# Patient Record
Sex: Female | Born: 1960 | Race: White | Hispanic: No | Marital: Married | Smoking: Never smoker
Health system: Southern US, Community
[De-identification: ages and names within clinical notes are randomized; demographics above are authoritative.]

## PROBLEM LIST (undated history)

## (undated) HISTORY — PX: GASTRECTOMY: SHX58

## (undated) HISTORY — PX: ABDOMINAL HYSTERECTOMY: SHX81

## (undated) HISTORY — PX: CHOLECYSTECTOMY: SHX55

---

## 1999-03-25 ENCOUNTER — Encounter: Payer: Self-pay | Admitting: Emergency Medicine

## 1999-03-25 ENCOUNTER — Emergency Department (HOSPITAL_COMMUNITY): Admission: EM | Admit: 1999-03-25 | Discharge: 1999-03-25 | Payer: Self-pay | Admitting: *Deleted

## 1999-03-27 ENCOUNTER — Encounter: Payer: Self-pay | Admitting: Urology

## 1999-03-27 ENCOUNTER — Ambulatory Visit (HOSPITAL_COMMUNITY): Admission: RE | Admit: 1999-03-27 | Discharge: 1999-03-27 | Payer: Self-pay | Admitting: Urology

## 1999-03-29 ENCOUNTER — Emergency Department (HOSPITAL_COMMUNITY): Admission: EM | Admit: 1999-03-29 | Discharge: 1999-03-29 | Payer: Self-pay | Admitting: Emergency Medicine

## 1999-03-29 ENCOUNTER — Encounter: Payer: Self-pay | Admitting: Urology

## 1999-08-21 ENCOUNTER — Other Ambulatory Visit: Admission: RE | Admit: 1999-08-21 | Discharge: 1999-08-21 | Payer: Self-pay | Admitting: Obstetrics and Gynecology

## 2000-02-25 ENCOUNTER — Encounter: Payer: Self-pay | Admitting: Family Medicine

## 2000-02-25 ENCOUNTER — Encounter: Admission: RE | Admit: 2000-02-25 | Discharge: 2000-02-25 | Payer: Self-pay | Admitting: Family Medicine

## 2000-08-15 ENCOUNTER — Emergency Department (HOSPITAL_COMMUNITY): Admission: EM | Admit: 2000-08-15 | Discharge: 2000-08-15 | Payer: Self-pay | Admitting: Emergency Medicine

## 2000-08-15 ENCOUNTER — Encounter: Payer: Self-pay | Admitting: *Deleted

## 2000-09-24 ENCOUNTER — Encounter: Payer: Self-pay | Admitting: Urology

## 2000-09-24 ENCOUNTER — Encounter: Admission: RE | Admit: 2000-09-24 | Discharge: 2000-09-24 | Payer: Self-pay | Admitting: Urology

## 2000-09-30 ENCOUNTER — Other Ambulatory Visit: Admission: RE | Admit: 2000-09-30 | Discharge: 2000-09-30 | Payer: Self-pay | Admitting: Obstetrics and Gynecology

## 2001-06-27 ENCOUNTER — Encounter: Payer: Self-pay | Admitting: Emergency Medicine

## 2001-06-27 ENCOUNTER — Emergency Department (HOSPITAL_COMMUNITY): Admission: EM | Admit: 2001-06-27 | Discharge: 2001-06-27 | Payer: Self-pay | Admitting: Emergency Medicine

## 2001-07-02 ENCOUNTER — Ambulatory Visit (HOSPITAL_COMMUNITY): Admission: RE | Admit: 2001-07-02 | Discharge: 2001-07-02 | Payer: Self-pay | Admitting: Urology

## 2001-07-02 ENCOUNTER — Encounter: Payer: Self-pay | Admitting: Urology

## 2001-07-16 ENCOUNTER — Encounter: Admission: RE | Admit: 2001-07-16 | Discharge: 2001-07-16 | Payer: Self-pay | Admitting: Urology

## 2001-07-16 ENCOUNTER — Encounter: Payer: Self-pay | Admitting: Urology

## 2001-10-15 ENCOUNTER — Other Ambulatory Visit: Admission: RE | Admit: 2001-10-15 | Discharge: 2001-10-15 | Payer: Self-pay | Admitting: Obstetrics and Gynecology

## 2002-10-25 ENCOUNTER — Other Ambulatory Visit: Admission: RE | Admit: 2002-10-25 | Discharge: 2002-10-25 | Payer: Self-pay | Admitting: Obstetrics and Gynecology

## 2003-12-12 ENCOUNTER — Other Ambulatory Visit: Admission: RE | Admit: 2003-12-12 | Discharge: 2003-12-12 | Payer: Self-pay | Admitting: Obstetrics and Gynecology

## 2003-12-27 ENCOUNTER — Ambulatory Visit (HOSPITAL_COMMUNITY): Admission: RE | Admit: 2003-12-27 | Discharge: 2003-12-27 | Payer: Self-pay | Admitting: Gastroenterology

## 2004-04-03 ENCOUNTER — Ambulatory Visit (HOSPITAL_COMMUNITY): Admission: RE | Admit: 2004-04-03 | Discharge: 2004-04-03 | Payer: Self-pay | Admitting: Gastroenterology

## 2004-04-09 ENCOUNTER — Encounter: Admission: RE | Admit: 2004-04-09 | Discharge: 2004-04-09 | Payer: Self-pay | Admitting: Gastroenterology

## 2004-04-24 ENCOUNTER — Observation Stay (HOSPITAL_COMMUNITY): Admission: RE | Admit: 2004-04-24 | Discharge: 2004-04-25 | Payer: Self-pay | Admitting: Surgery

## 2004-04-24 ENCOUNTER — Encounter (INDEPENDENT_AMBULATORY_CARE_PROVIDER_SITE_OTHER): Payer: Self-pay | Admitting: Specialist

## 2004-06-25 ENCOUNTER — Inpatient Hospital Stay (HOSPITAL_COMMUNITY): Admission: RE | Admit: 2004-06-25 | Discharge: 2004-06-27 | Payer: Self-pay | Admitting: Surgery

## 2005-03-25 ENCOUNTER — Other Ambulatory Visit: Admission: RE | Admit: 2005-03-25 | Discharge: 2005-03-25 | Payer: Self-pay | Admitting: Obstetrics and Gynecology

## 2005-04-20 IMAGING — RF DG CHOLANGIOGRAM OPERATIVE
1 series · 14 of 14 positions shown · non-contrast
Comparison: none

CLINICAL DATA: Gallstones.  
 OR CHOLANGIOGRAM 
 The common bile duct is well visualized without evidence of stone.  No obstruction of the common bile duct. There is contrast in the duodenum. 
 IMPRESSION 
 No obstruction or retained stones of the common bile duct.

[Series 1: run · 14 of 14 slices shown]
[im 1/14]
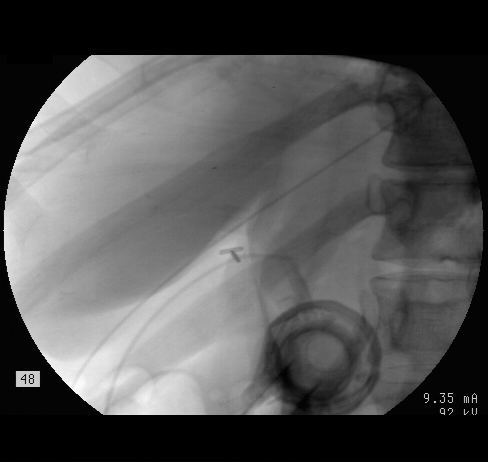
[im 2/14]
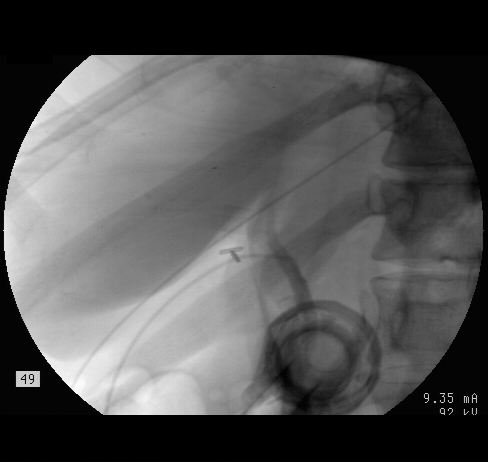
[im 3/14]
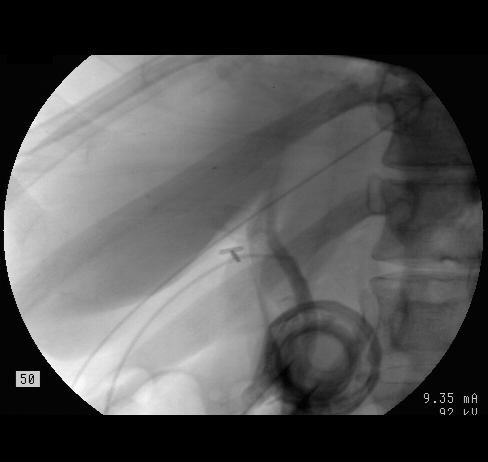
[im 4/14]
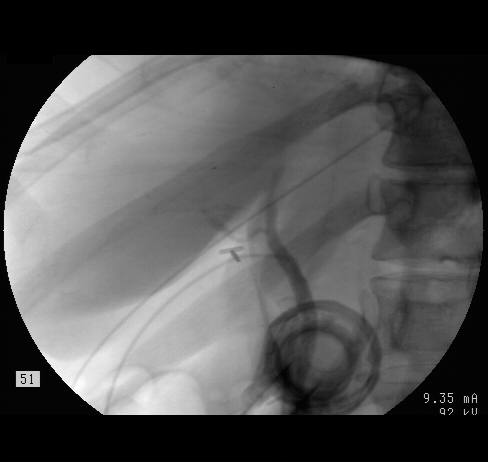
[im 5/14]
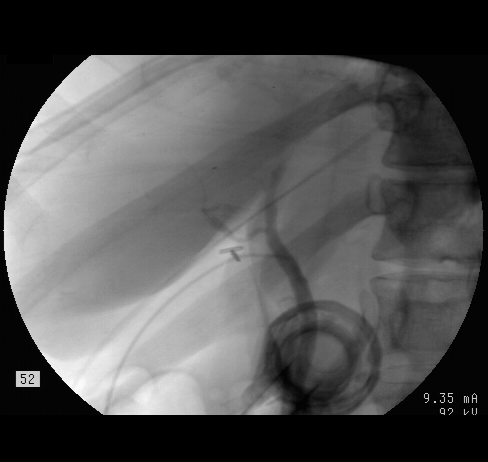
[im 6/14]
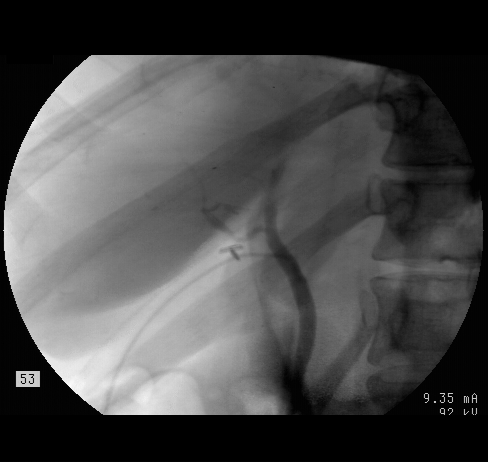
[im 7/14]
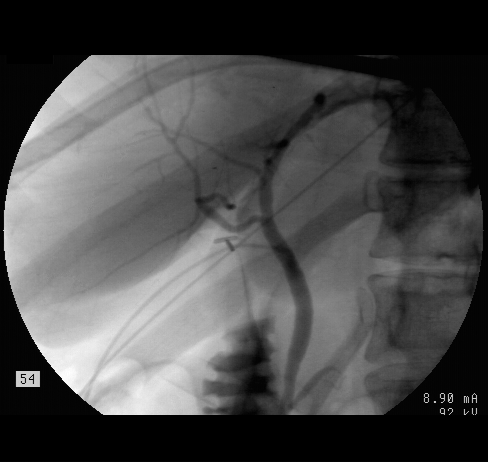
[im 8/14]
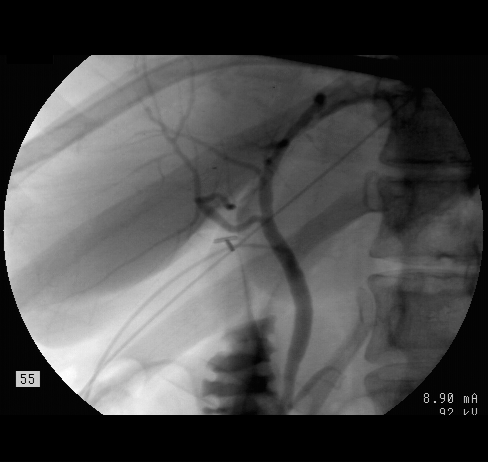
[im 9/14]
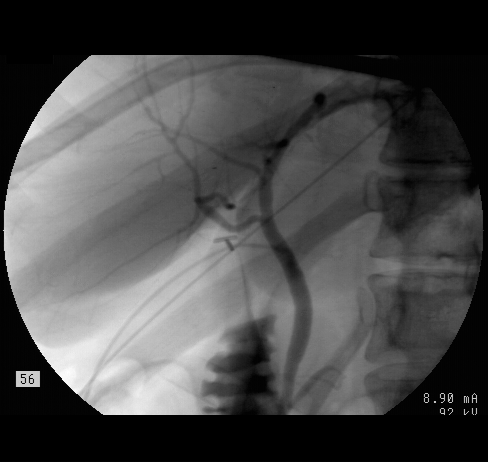
[im 10/14]
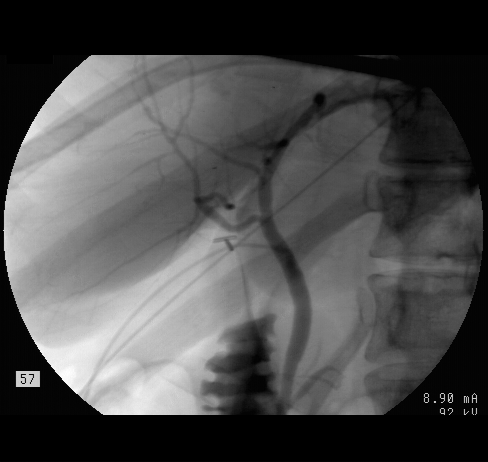
[im 11/14]
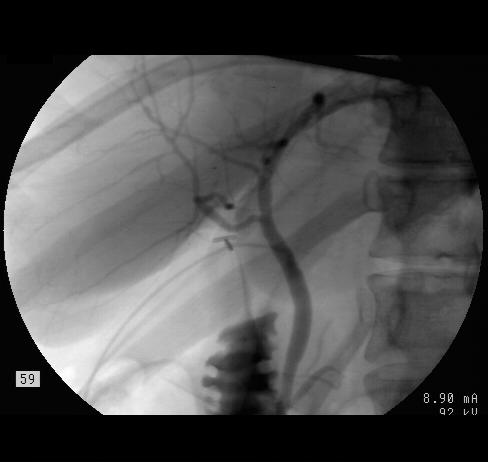
[im 12/14]
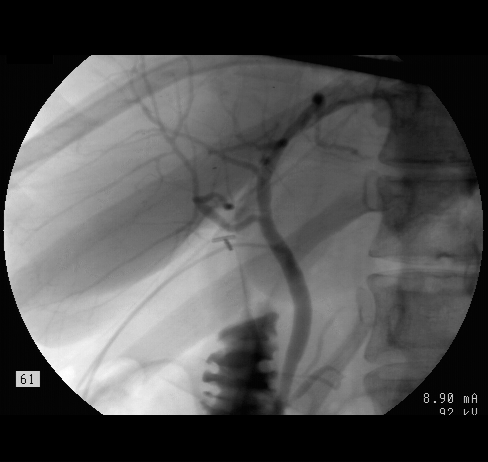
[im 13/14]
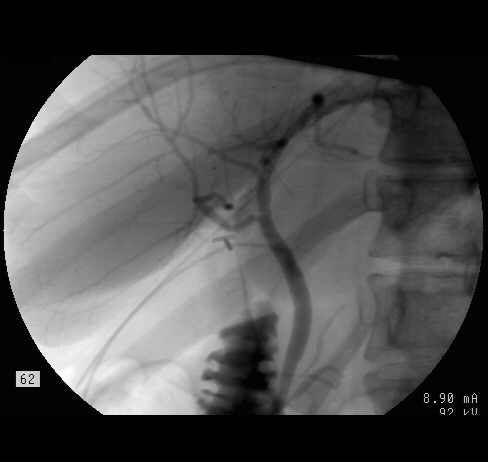
[im 14/14]
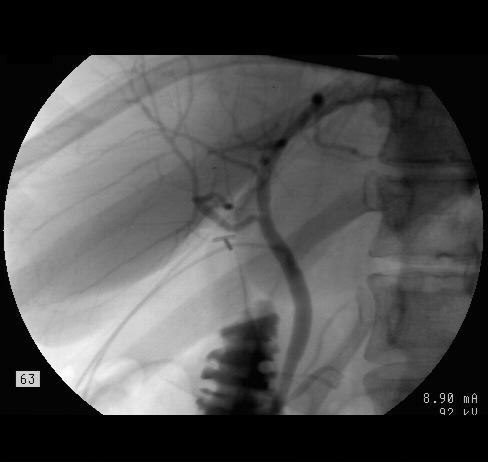

[14 of 14 positions shown; findings below may reference images not displayed]

## 2006-01-22 ENCOUNTER — Emergency Department (HOSPITAL_COMMUNITY): Admission: EM | Admit: 2006-01-22 | Discharge: 2006-01-22 | Payer: Self-pay | Admitting: Family Medicine

## 2006-06-12 ENCOUNTER — Other Ambulatory Visit: Admission: RE | Admit: 2006-06-12 | Discharge: 2006-06-12 | Payer: Self-pay | Admitting: Obstetrics and Gynecology

## 2006-11-12 ENCOUNTER — Ambulatory Visit (HOSPITAL_COMMUNITY): Admission: RE | Admit: 2006-11-12 | Discharge: 2006-11-13 | Payer: Self-pay | Admitting: Obstetrics and Gynecology

## 2006-11-12 ENCOUNTER — Encounter (INDEPENDENT_AMBULATORY_CARE_PROVIDER_SITE_OTHER): Payer: Self-pay | Admitting: *Deleted

## 2007-02-06 ENCOUNTER — Emergency Department (HOSPITAL_COMMUNITY): Admission: EM | Admit: 2007-02-06 | Discharge: 2007-02-07 | Payer: Self-pay | Admitting: Emergency Medicine

## 2008-01-14 ENCOUNTER — Ambulatory Visit (HOSPITAL_COMMUNITY): Admission: RE | Admit: 2008-01-14 | Discharge: 2008-01-14 | Payer: Self-pay | Admitting: Family Medicine

## 2008-02-02 IMAGING — CR DG CHEST 2V
2 series · 2 of 2 positions shown · non-contrast
Comparison: 04/20/2004

CLINICAL DATA: Chest pain

CHEST - 2 VIEW:

[w chest pa]
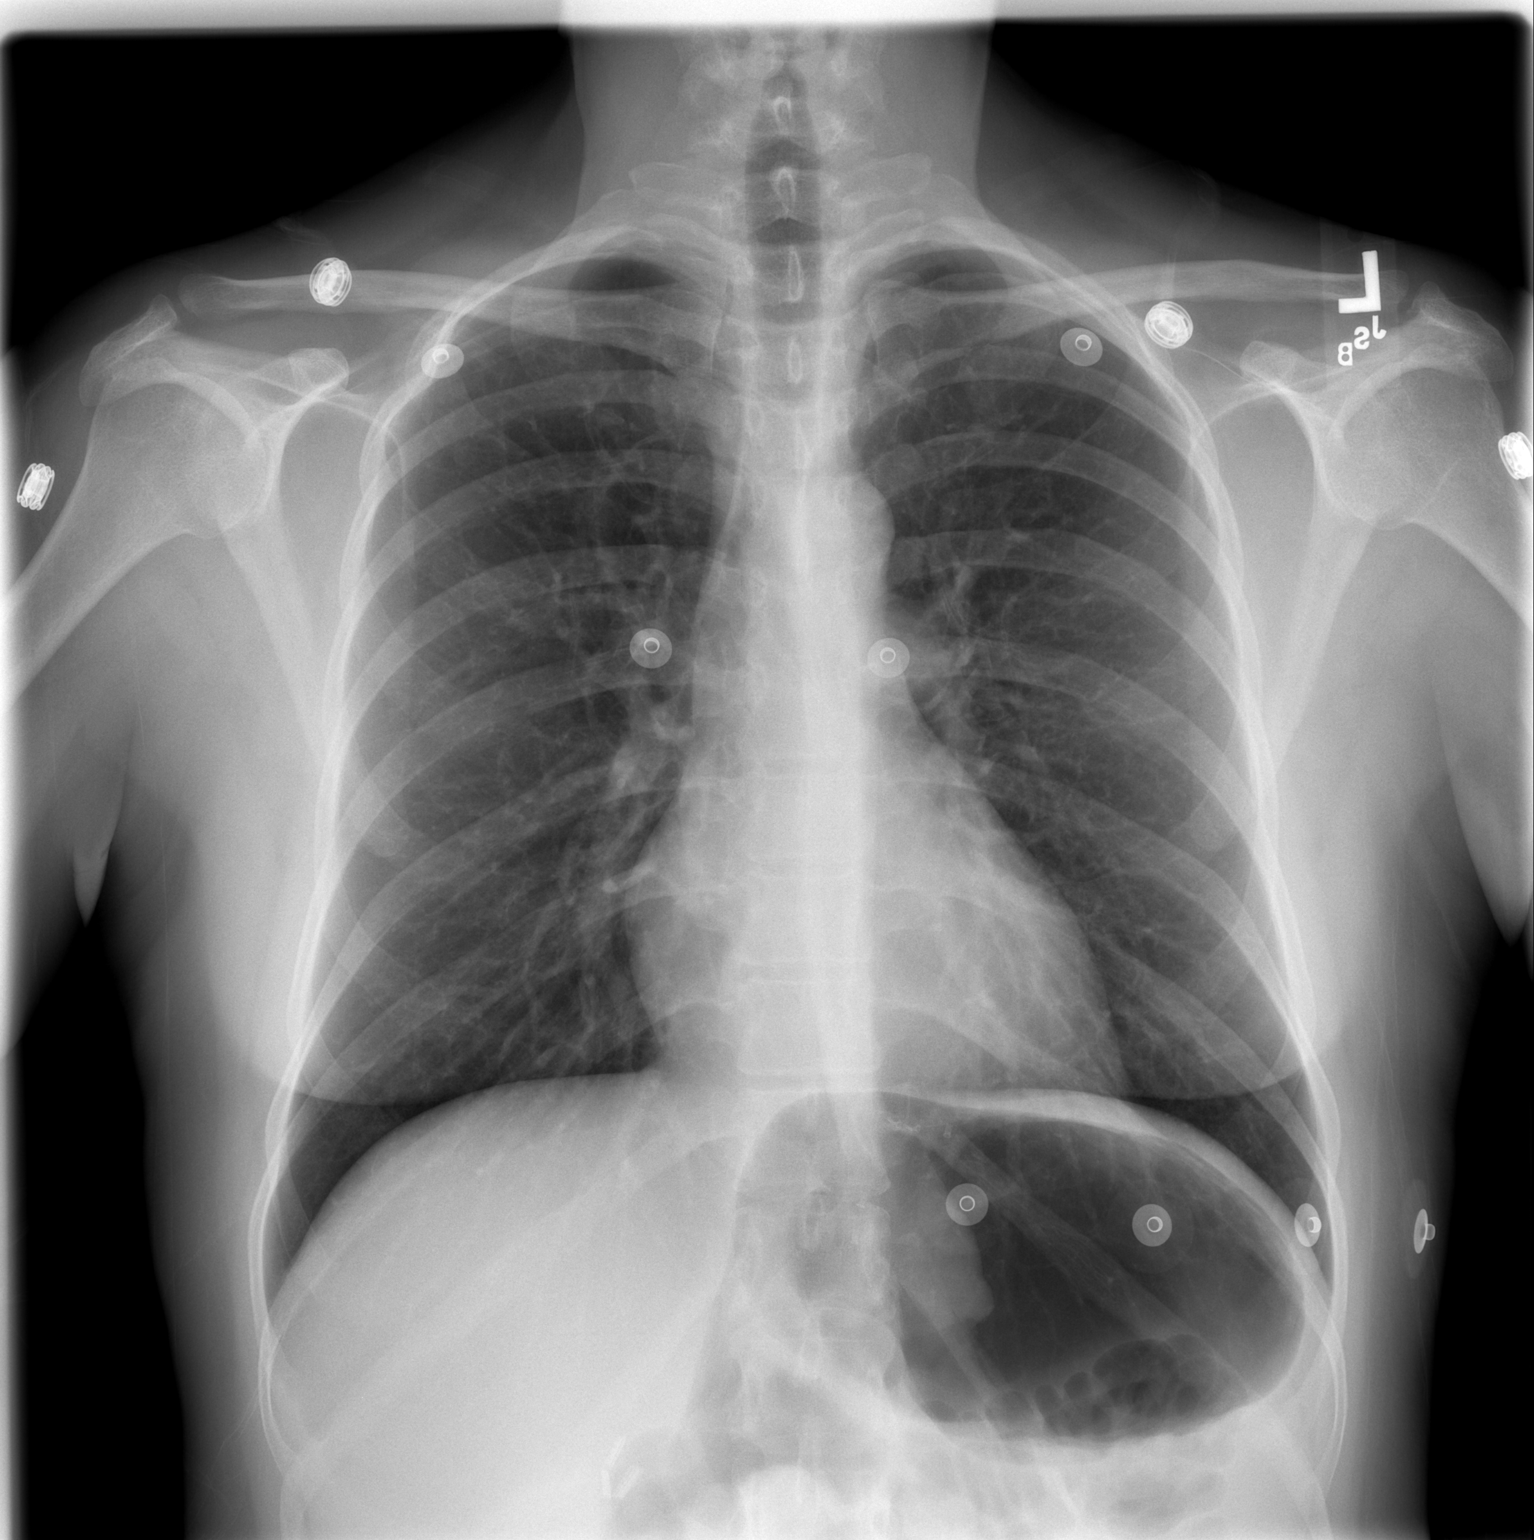

[w chest lat]
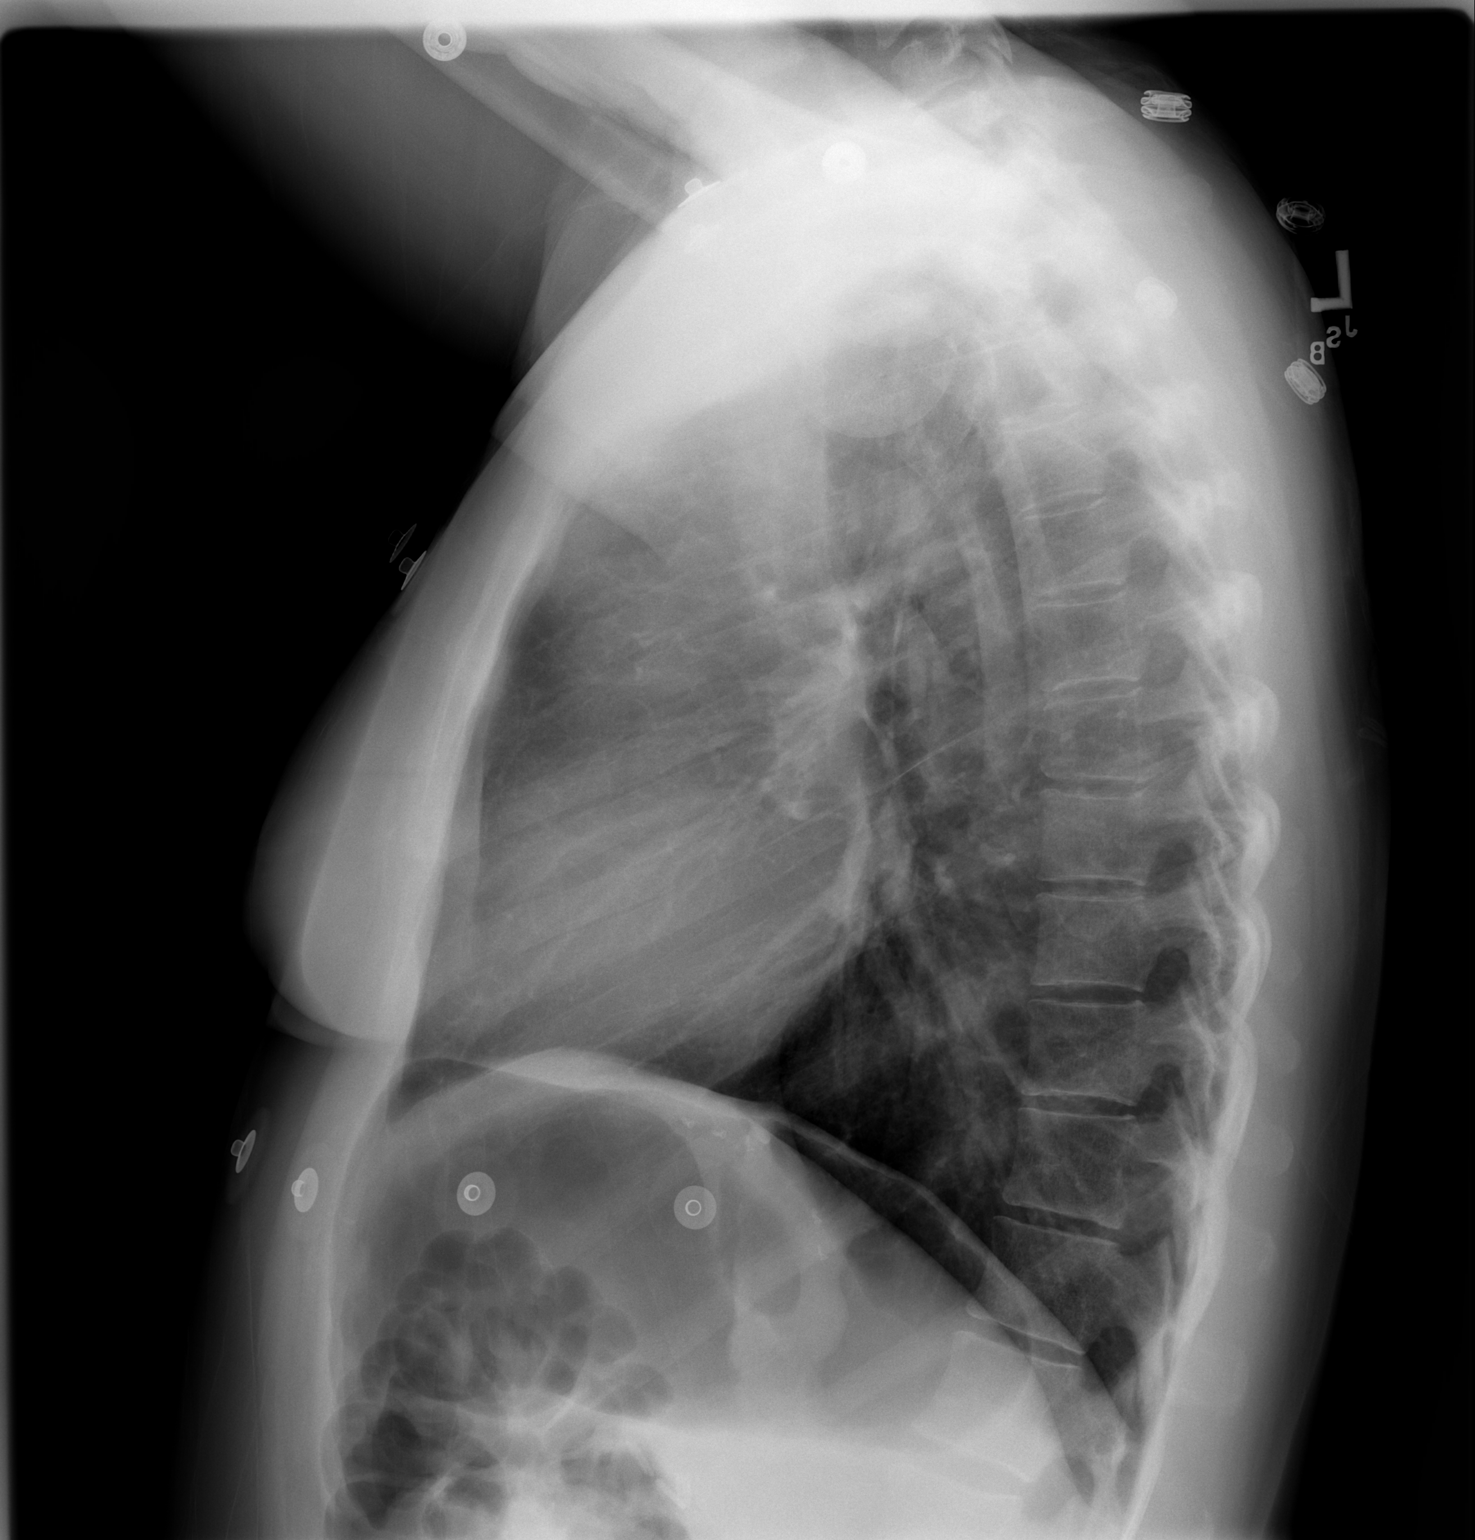

[2 of 2 positions shown; findings below may reference images not displayed]

FINDINGS: The heart size and mediastinal contours are within normal limits. 
Both lungs are clear.  The visualized skeletal structures are unremarkable.
IMPRESSION: No active cardiopulmonary disease

## 2008-02-03 IMAGING — CR DG ABDOMEN 2V
3 series · 3 of 3 positions shown · non-contrast
Comparison: None

CLINICAL DATA: Abdominal pain

ABDOMEN - 2 VIEW

[w abdomen upright]
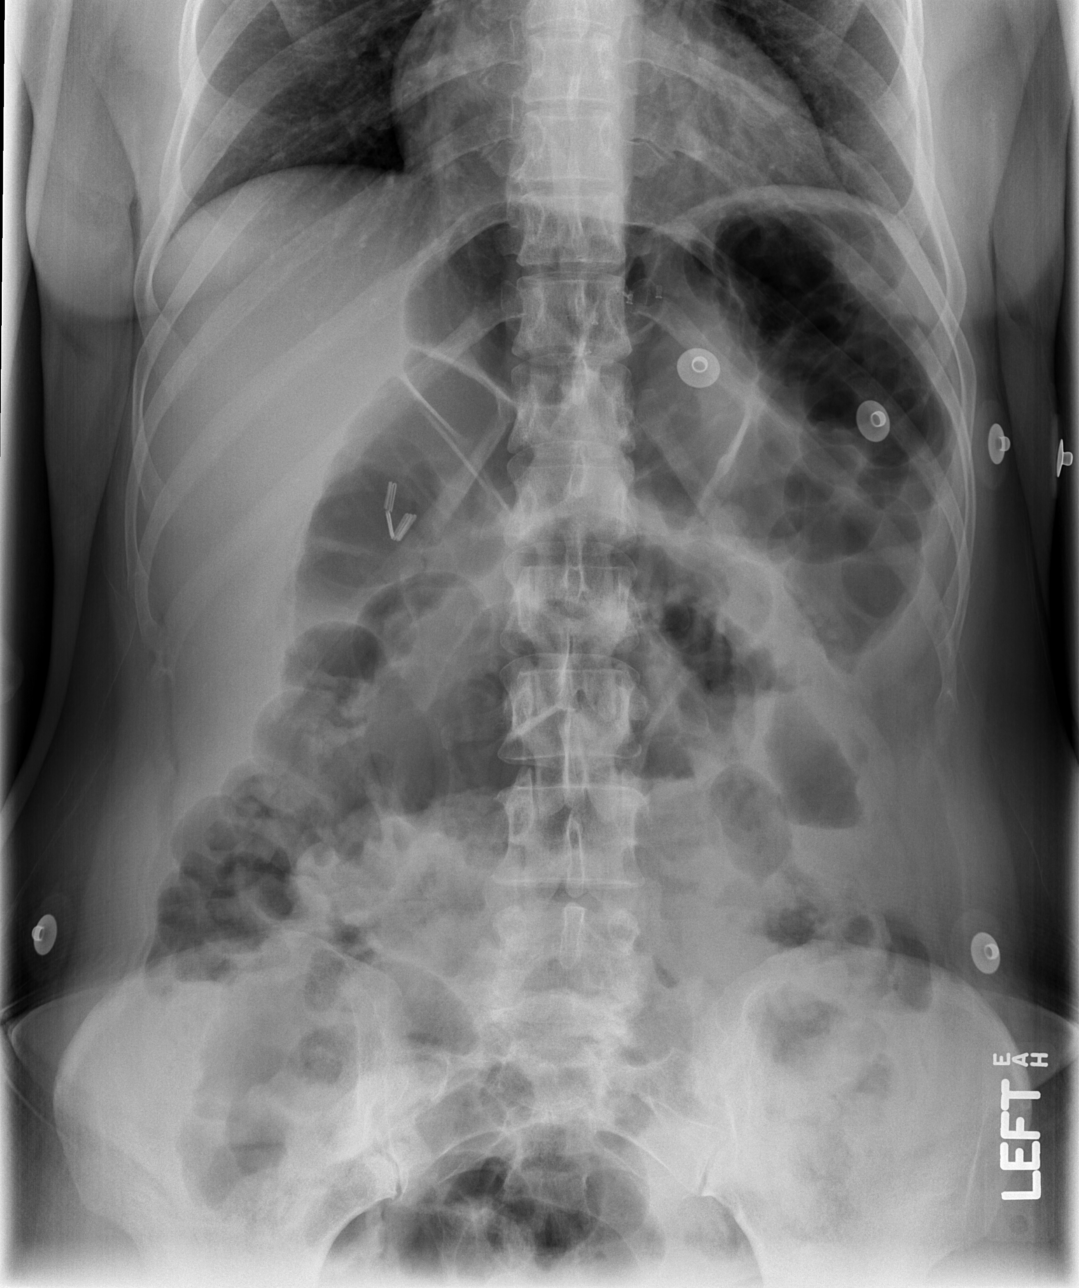

[t abdomen supine (1 of 2)]
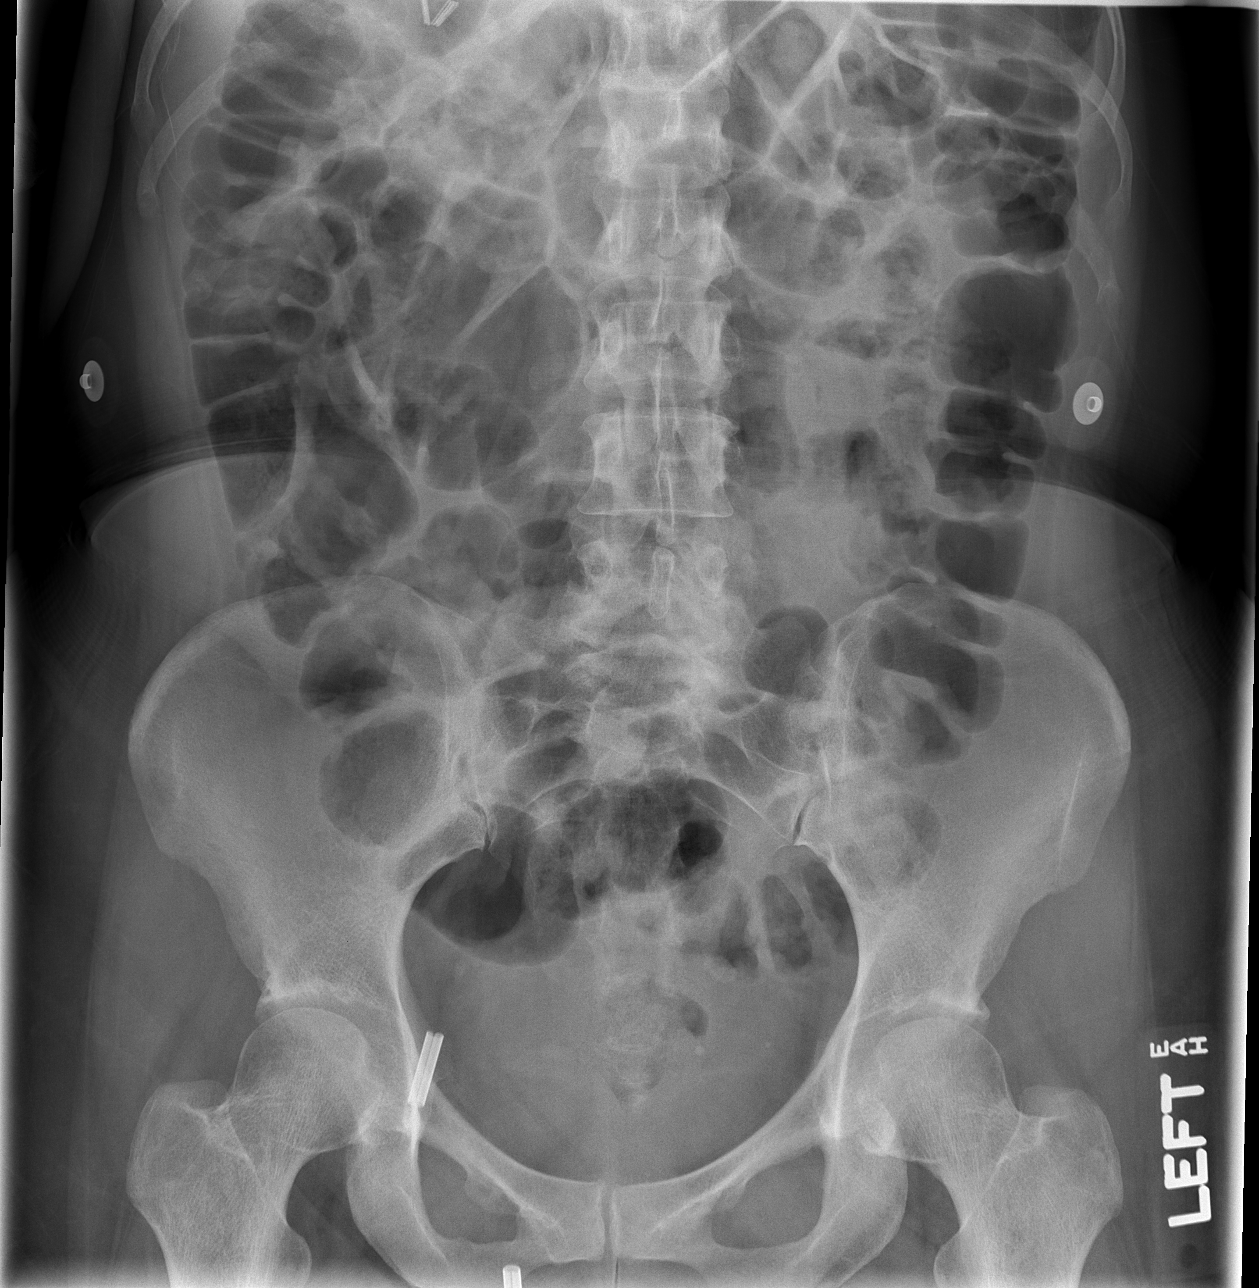

[t abdomen supine (2 of 2)]
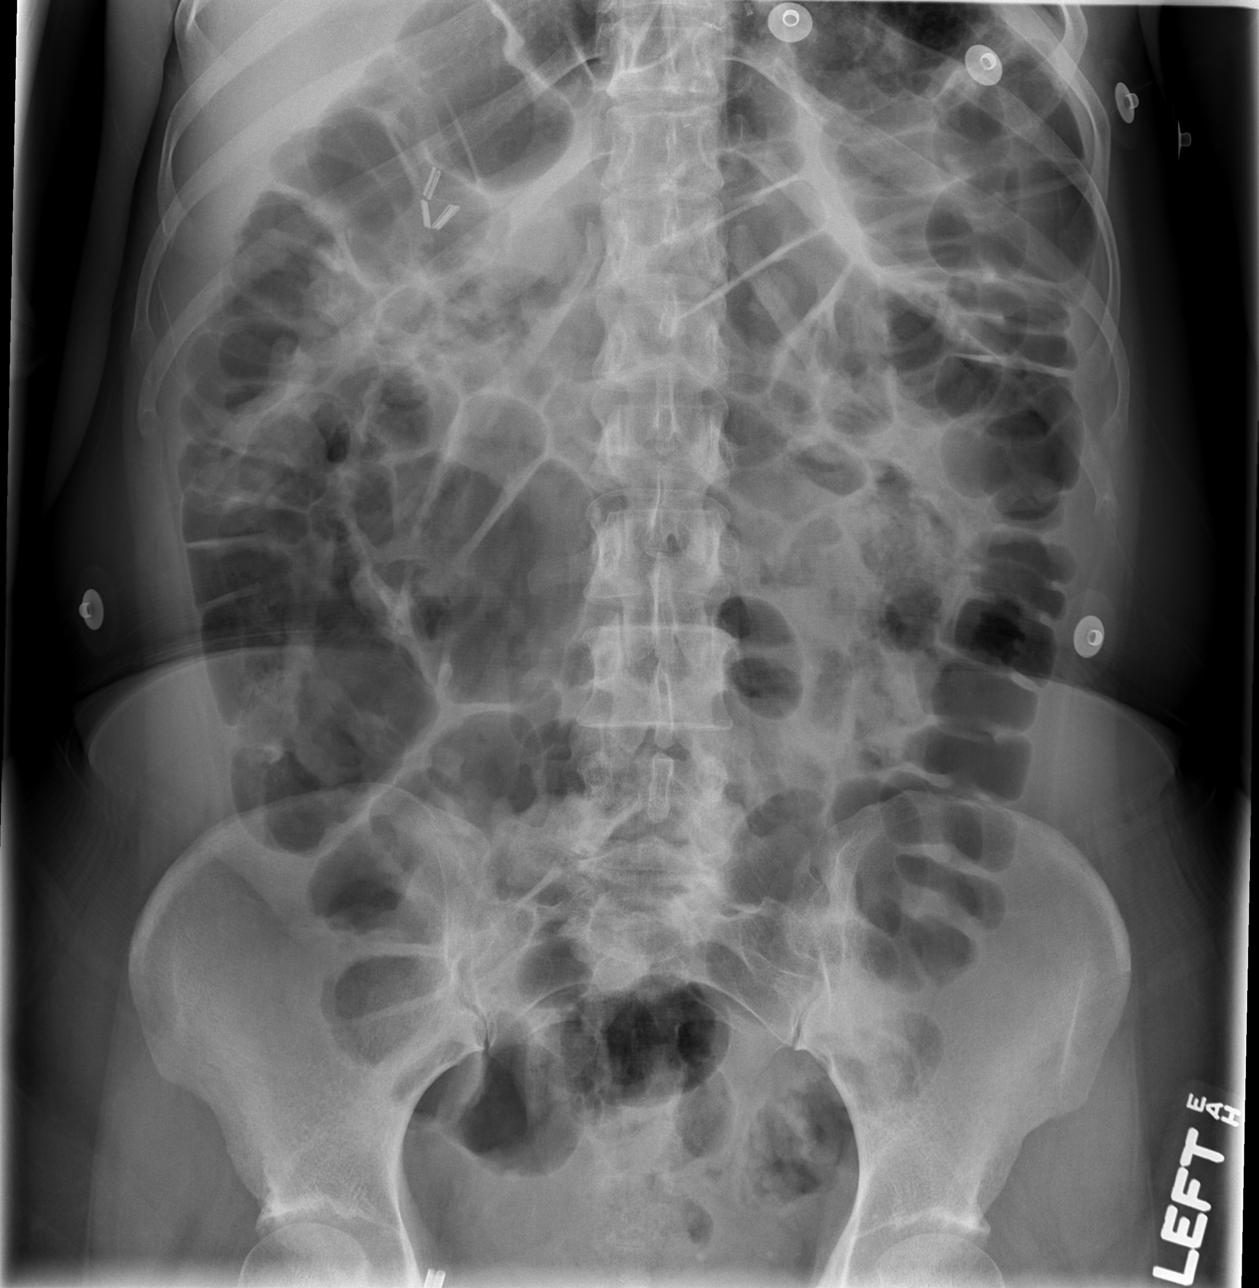

[3 of 3 positions shown; findings below may reference images not displayed]

FINDINGS: There is diffuse gaseous distention of bowel. Question ileus. Patient
status post cholecystectomy. No evidence of obstruction or free air. No
organomegaly or suspicious calcification.

IMPRESSION

Diffuse gaseous distention of bowel, question ileus.

## 2011-04-19 NOTE — Op Note (Signed)
[name], Brittney [name]                          ACCOUNT NO.:  [id number]   MEDICAL RECORD NO.:  [id number]                   PATIENT TYPE:  AMB   LOCATION:  ENDO                                 FACILITY:  [facility]   PHYSICIAN:  John C. Madilyn Fireman, M.D.                 DATE OF BIRTH:  October 16, 1961   DATE OF PROCEDURE:  04/03/2004  DATE OF DISCHARGE:  04/03/2004                                 OPERATIVE REPORT   INDICATIONS FOR PROCEDURE:  Refractory reflux symptoms under consideration  for antireflux surgery.   RESULTS:  1. Upper esophageal sphincter:  Not interpreted.  2. Esophageal body:  Normal peristaltic waves induced by wet swallows.  3. Lower esophageal sphincter:  Low resting pressure at 12.6 (normal 10 to     45), normal sphincter relaxation.   IMPRESSION:  Normal study.   PLAN:  No contraindication to antireflux surgery.                                               John C. Madilyn Fireman, M.D.    JCH/MEDQ  D:  04/09/2004  T:  04/10/2004  Job:  [id number]   cc:   Velora Heckler, M.D.  [address] [place]  [place] [address]  Fax: [phone]   Marcos Eke. Hal Hope, M.D.  [address] [place]  [place] [address]  Fax: [phone]

## 2011-04-19 NOTE — Op Note (Signed)
Brittney [name], Brittney [name]                ACCOUNT NO.:  [id number]   MEDICAL RECORD NO.:  [id number]          PATIENT TYPE:  AMB   LOCATION:  SDC                           [facility]ACILITY:  [facility]   PHYSICIAN:  Zenaida Niece, M.D.DATE OF BIRTH:  Apr 11, 1961   DATE OF PROCEDURE:  11/12/2006  DATE OF DISCHARGE:                               OPERATIVE REPORT   PREOPERATIVE DIAGNOSIS:  Symptomatic leiomyomatous uterus.   POSTOPERATIVE DIAGNOSIS:  Symptomatic leiomyomatous uterus.   PROCEDURES:  Laparoscopic-assisted vaginal hysterectomy.   SURGEON:  Zenaida Niece, M.D.   ASSISTANT:  Sherron Monday, M.D.   ANESTHESIA:  General endotracheal tube.   SPECIMENS:  Uterus to pathology.   ESTIMATED BLOOD LOSS:  200 mL.   COMPLICATIONS:  None.   FINDINGS:  She had a 8-10 weeks size irregular uterus with multiple  uterine fibroids.  The remainder of her abdomen and pelvis appeared  normal.   PROCEDURE IN DETAIL:  The patient was taken to the operating room and  placed in the dorsosupine position.  General anesthesia was induced and  she was placed in mobile stirrups.  Abdomen, perineum and vagina were  then prepped and draped in the usual sterile fashion, bladder drained  with a red rubber catheter, Hulka tenaculum applied to the cervix for  uterine manipulation.  Infraumbilical skin was then infiltrated with  quarter percent Marcaine and a three-quarter centimeter vertical  incision was made.  The Veress needle was inserted into the peritoneal  cavity and placement confirmed by the water drop test and opening  pressure of 5 mmHg.  CO2 gas was insufflated to a pressure of 12 mmHg  and the Veress needle was removed.  The 5-mm XL trocar was then  introduced with direct visualization with the laparoscope.  Inspection  revealed a normal pelvis.  5 mm ports were placed bilaterally also under  direct visualization.  Using the harmonic scalpel Ace the utero-ovarian  pedicles, round ligaments and broad  ligaments were taken down with  adequate hemostasis.  The anterior peritoneum was incised across the  anterior portion of uterus and the bladder pushed inferior.  Pedicles  were found to be hemostatic and attention was turned vaginally.   Legs were elevated in stirrups and a weighted speculum was inserted in  the vagina.  Deaver retractors were used anteriorly and laterally.  Christella Hartigan tenaculums were used to grasp the cervix and the cervicovaginal  mucosa was infiltrated with dilute solution of Pitressin.  This was then  incised circumferentially with electrocautery.  Sharp dissection was  then used to further free the vagina from the cervix.  Anterior  peritoneum was dissected off and entered sharply and a Deaver retractor  used to retract the bladder anterior.  Posterior cul-de-sac was  identified and entered sharply and a Bonanno speculum inserted into the  posterior cul-de-sac.  Uterosacral pedicles were clamped, transected and  ligated with #1 chromic and tagged for later use.  Uterine arteries,  cardinal ligaments and the remainder of the broad ligament were also  clamped, transected and ligated with #1 chromic and the uterus was  removed.  Small amount of bleeding from the posterior vaginal cuff just  medial to the uterosacral pedicles was controlled with figure-of-eight  sutures of #1 chromic.  The remaining pedicles were hemostatic.  Uterosacral ligaments were plicated in the midline with 2-0 silk and the  previously tagged uterosacral pedicles were also tied in the midline.  The vagina was then closed in a vertical fashion with running locking 2-  0 Vicryl with adequate closure and adequate hemostasis.  A Foley  catheter was then inserted and all instruments were removed from the  vagina.   Attention was turned back to the laparoscope.  Dr. Ellyn Hack, myself, and  the scrub techs changed gloves.  The legs were lowered in the stirrups.  The abdomen was reinsufflated with CO2 gas and  inspection revealed a  small amount of bleeding from each infundibulopelvic pedicle.  These  were coagulated with bipolar cautery and adequate hemostasis was  achieved.  The remainder of the pelvis and pedicles were hemostatic.  The lateral ports were removed under direct visualization.  All gas was  allowed to deflate from the abdomen and the umbilical trocar was then  removed.  Skin incisions were closed with interrupted subcuticular  sutures of 4-0 Vicryl followed by Dermabond.  The patient tolerated the  procedure well and was taken to the recovery room in stable condition.  Counts were correct x2, she received Ancef 1 gram prior to the procedure  and had PAS hose on throughout the procedure.      Zenaida Niece, M.D.  Electronically Signed     TDM/MEDQ  D:  11/12/2006  T:  11/12/2006  Job:  [id number]

## 2011-04-19 NOTE — Op Note (Signed)
Brittney [name], Brittney [name]                          ACCOUNT NO.:  [id number]   MEDICAL RECORD NO.:  [id number]                   PATIENT TYPE:  AMB   LOCATION:  ENDO                                 FACILITY:  [facility]   PHYSICIAN:  John C. Madilyn Fireman, M.D.                 DATE OF BIRTH:  03/29/1961   DATE OF PROCEDURE:  04/03/2004  DATE OF DISCHARGE:  04/03/2004                                 OPERATIVE REPORT   PROCEDURE:  Twenty-four hour pH study.   INDICATION FOR PROCEDURE:  Patient with gastroesophageal reflux with poor  medical control, under consideration for antireflux surgery.   RESULTS:  Her total DeMeester score was 36.3 with upper limits of normal 22.  She had good correlation between reported symptoms and documented reflux  episodes, particularly in regard to heartburn, with 100% correlation.   IMPRESSION:  Strongly positive 24 hour pH study with good symptom  correlation.   PLAN:  This supports consideration of antireflux surgery.                                               John C. Madilyn Fireman, M.D.    JCH/MEDQ  D:  04/09/2004  T:  04/10/2004  Job:  [id number]   cc:   Velora Heckler, M.D.  [address] [place]  [place] [address]  Fax: [phone]

## 2011-04-19 NOTE — H&P (Signed)
[name], [name] NO.:  [id number]   MEDICAL RECORD NO.:  [id number]          PATIENT TYPE:  AMB   LOCATION:  SDC                           [facility]ILITY[facility]   PHYSICIAN:  Huel Cote, M.D. DATE OF BIRTH:  Dec 01, 1961   DATE OF ADMISSION:  11/12/2006  DATE OF DISCHARGE:                              HISTORY & PHYSICAL   Patient is a 50 year old G1, P1 who comes in for a scheduled  laparoscopic-assisted vaginal hysterectomy, possible bilateral salpingo-  oophorectomy, and possible total abdominal hysterectomy.  She is a  patient who has had an ongoing problem with menorrhagia and dysmenorrhea  related to a fibroid uterus and had dealt with these for many years  using oral contraceptives; however, these began to fail over the last  year, and for this reason, she was interested in pursuing more  definitive surgical therapy.  Patient has been pretreated with Lupron  for approximately three months, given that her uterus was slightly bulky  in size, approximately 12-14 weeks, and it was felt that this would  better enable a vaginal approach.  Again, she did respond to Lupron and  had some decrease in the size of the uterus, which is now more than 10-  12 weeks in size.   PAST MEDICAL HISTORY:  History of kidney stone, hypercholesterolemia,  and chronic hypertension.   PAST SURGICAL HISTORY:  She had a stent and lithotripsy for a kidney  stone and a Nissen repair in July, 2005.   PAST OBSTETRICAL HISTORY:  Vaginal delivery of a 7 pound, 2 ounce  infant.   PAST GYN HISTORY:  No abnormal Pap smears.   Her current medications include enalapril, Lupron, and Lipitor.   FAMILY HISTORY:  Grandmother with breast cancer.  No colon cancer.  Heart disease in a father, uncle, and grandparents.   PHYSICAL EXAMINATION:  VITAL SIGNS:  Weight 141 pounds.  Blood pressure  124/80.  CARDIAC:  Regular rate and rhythm.  LUNGS:  Clear.  ABDOMEN:  Uterus is approximately 10-12  weeks in size.  Is mobile and  has fair descensus.  The adnexa have no palpable masses.   All risks and benefits of surgery were discussed with the patient in  detail, including bleeding, infection, and possible damage to bowel and  bladder.  She understands that if any of these complications arise, she  would likely need an abdominal incision to correct the problem and would  perhaps have a delay in recovery, should any intestinal or bladder  injury be a problem.  We discussed that, given that her uterus is  borderline in size, that it is also possible that should she have  bleeding or difficult descensus, which would lead to an abdominal  incision to complete the surgery; however, I felt that she had at least  a 75-80% chance of the procedure being completed vaginally.  She was  offered to continue Lupron therapy, as it seemed to be working well;  however, wished to proceed with the surgery as stated at this time.  She  accepts all risks of surgery and desires to  proceed with the surgery on  November 07, 2006 at 7:30  a.m.  Patient also met with Dr. Jackelyn Knife, my partner, to discuss the  surgery and feels comfortable that should I go out on maternity leave  with delivery prior to her surgery that he would perform the surgery and  has no difficulty with this.      Huel Cote, M.D.  Electronically Signed     KR/MEDQ  D:  11/06/2006  T:  11/06/2006  Job:  [id number]

## 2011-04-19 NOTE — Op Note (Signed)
Brittney [name], Brittney [name]                          ACCOUNT NO.:  [id number]   MEDICAL RECORD NO.:  [id number]                   PATIENT TYPE:  OBV   LOCATION:  1610                                 FACILITY:  [facility]   PHYSICIAN:  Velora Heckler, M.D.                DATE OF BIRTH:  Dec 02, 1961   DATE OF PROCEDURE:  06/25/2004  DATE OF DISCHARGE:                                 OPERATIVE REPORT   PREOPERATIVE DIAGNOSIS:  Gastroesophageal reflux disease, hiatal hernia.   POSTOPERATIVE DIAGNOSIS:  Gastroesophageal reflux disease, hiatal hernia.   PROCEDURE:  Laparoscopic Nissen fundoplication.   SURGEON:  Velora Heckler, M.D.   ASSISTANT:  Thornton Park. Daphine Deutscher, M.D.   ANESTHESIA:  General, per Dr. Eilene Ghazi.   ESTIMATED BLOOD LOSS:  Minimal.   PREPARATION:  Betadine.   COMPLICATIONS:  None.   INDICATIONS:  Patient is a 50 year old white female well known to my  practice.  She had been initially seen in April, 2005 for symptomatic  gastroesophageal reflux.  Patient has a 10-year history of reflux.  She had  an upper endoscopy demonstrating a 10 cm hiatal hernia.  She underwent  esophageal manometry which showed normal motility.  She underwent a pH probe  which showed an abnormally high DeMeester score.  Patient was referred for  gastroesophageal reflux surgery with laparoscopic Nissen fundoplication;  however, in the interim, the patient developed symptomatic cholelithiasis.  She was prepared and taken to the operating room on May 24th, where she  underwent laparoscopic cholecystectomy.  Patient chose to defer antireflux  surgery until this time.  She now comes to the operating room for repair of  hiatal hernia and Nissen fundoplication for treatment of gastroesophageal  reflux disease.   BODY OF REPORT:  The procedure is done in OR #11 at [facility].  Patient is brought to the operating room and placed in supine  position on the operating room table.  Following  administration of general  anesthesia, the patient is positioned and then prepped and draped in the  usual strict aseptic fashion.  After ascertaining that an adequate level of  anesthesia had been obtained, an infraumbilical incision is reopened with a  #15 blade.  Dissection is carried down to the fascia.  The fascia is incised  in the midline.  The peritoneal cavity is entered cautiously.  A 0 Vicryl  purse-string suture is placed in the fascia.  A Hasson cannula is introduced  under direct vision and secured with a purse-string suture.  The abdomen is  insufflated with carbon dioxide.  The laparoscope is then introduced, and  the abdomen explored.  There are remarkably few adhesions, due to the  previous surgeries.  Operative ports are placed along the right costal  margin in the anterior axillary line and mid clavicular line.  Two further  ports are placed in the mid clavicular line and the  anterior axillary line  on the left.  A paddle-type liver retractor is inserted and placed under the  left lateral segment of the liver.  The liver is elevated, and the  gastroesophageal junction is identified.  Using the harmonic scalpel, the  lesser omentum is opened in the pars flaccidium.  Dissection is carried  cephalad to the esophageal hiatus.  With gentle retraction with a Babcock  clamp on the fat pad at the GE junction, the hiatal hernia is reduced.  Dissection is carried around the esophagus to finding both the right and  left crura of the diaphragm.  A window is then created posterior to the  esophagus.  Next, using the harmonic scalpel, the greater curvature of the  stomach is freed.  The short gastric vessels are divided with the harmonic  scalpel.  Dissection is carried around the greater curvature and fundus of  the stomach up to the GE junction.  The crura of the diaphragm are then  closed posteriorly with interrupted 0 Surgitek sutures secured with the  TyKnot device.  At this  point, a 50 French lighted bougie dilator was  inserted by Dr. Eilene Ghazi through the esophagus and into the stomach.  There appears to be good approximation of the esophagus to the esophageal  hiatus without constriction.  No further hiatal repair is performed.  Next,  the fundus of the stomach is brought through the window behind the esophagus  at a full 360 degree Nissen.  A wrap is created.  The stomach is sutured  with 0 Surgitek sutures from the left side of the fundus to the anterior  esophagus to the right side of the fundus.  Again, the sutures are secured  with the TyKnot device.  Three sutures are placed over a distance of  approximately 3 cm on the anterior esophagus.  Representative photographs  are made for the medical record.  Bougie dilator is removed.  Good  hemostasis is noted.  Paddle retractor is removed.  Ports are removed under  direct vision.  Good hemostasis noted at all port sites.  The Hasson cannula  is removed.  Pneumoperitoneum is released.  A 0 Vicryl purse-string suture  is tied securely.  All port sites are anesthetized with local anesthetic.  All wounds are closed with interrupted 4-0 Vicryl subcuticular sutures.  The  wounds are washed and dried and Benzoin and Steri-Strips are applied.  Sterile dressings are applied.  Patient is awakened from anesthesia and  brought to the recovery room in stable condition.  Patient tolerated the  procedure well.                                               Velora Heckler, M.D.    TMG/MEDQ  D:  06/25/2004  T:  06/25/2004  Job:  [id number]   cc:   [name], M.D.  [address] [place]  [place] [address]  Fax: [phone]   [name], M.D.  [address]  [place]  [place] [address]  Fax: [phone]

## 2011-04-19 NOTE — Op Note (Signed)
NAMEARYAN, BELLO                          ACCOUNT NO.:  [id number]   MEDICAL RECORD NO.:  [id number]                   PATIENT TYPE:  AMB   LOCATION:  DAY                                  FACILITY:  [facility]   PHYSICIAN:  Velora Heckler, M.D.                DATE OF BIRTH:  [birth year]   DATE OF PROCEDURE:  04/24/2004  DATE OF DISCHARGE:                                 OPERATIVE REPORT   PREOPERATIVE DIAGNOSES:  Chronic cholecystitis, cholelithiasis.   POSTOPERATIVE DIAGNOSES:  Chronic cholecystitis, cholelithiasis.   PROCEDURE:  Laparoscopic cholecystectomy with intraoperative  cholangiography.   SURGEON:  Velora Heckler, M.D.   ASSISTANT:  Sandria Bales. Ezzard Standing, M.D.   ANESTHESIA:  General.   ESTIMATED BLOOD LOSS:  Minimal.   PREPARATION:  Betadine.   COMPLICATIONS:  None.   INDICATIONS FOR PROCEDURE:  The patient is a 50 year old white female with  symptomatic cholelithiasis.  The patient has had an increased number of  episodes of biliary colic over the past several weeks. Liver function tests  are normal. Ultrasound demonstrates multiple gallstones. The patient now  comes to surgery for cholecystectomy.   DESCRIPTION OF PROCEDURE:  The procedure was done in OR #11 at the [facility]. The patient was brought to the operating room,  placed in a supine position on the operating room table. Following the  administration of general anesthesia, the patient was prepped and draped in  the usual strict aseptic fashion. After ascertaining that an adequate level  of anesthesia had been obtained, an infraumbilical incision was made with a  #15 blade. Dissection was carried down to the fascia. The fascia was incised  in the midline, the peritoneal cavity is entered cautiously.  The #0 Vicryl  pursestring suture was placed in the fascia. A Hasson cannula is introduced  under direct vision and secured with a pursestring suture. The abdomen is  insufflated with carbon  dioxide. The laparoscope is introduced and the  abdomen explored. Operative ports are placed along the right costal margin  in the midline, mid clavicular line and anterior axillary line.  The fundus  of the gallbladder is grasped and retracted cephalad. Dissection is begun at  the neck of the gallbladder. The peritoneum is incised. The cystic duct is  dissected out along its length and a clip is placed at the neck of the  gallbladder. The cystic duct is incised with the scissors. Clear gold bile  emanates from the cystic duct. A Cook cholangiography catheter is introduced  through a stab wound in the right upper quadrant. It is inserted into the  cystic duct and secured with a Ligaclip.  Using C-arm fluoroscopy, real-time  cholangiography is performed. There is rapid filling of the common bile duct  which is normal caliber.  There is reflux of contrast into the right and  left hepatic ductal systems. There is free flow distally  into the duodenum.  There is slight reflux into the pancreatic duct. There are no filling  defects and no evidence of obstruction.  The clip is withdrawn and the [facility]  catheter is removed from the peritoneal cavity. The cystic duct is triply  clipped and divided. The cystic artery is dissected out, doubly clipped and  divided. The posterior branch of the cystic artery is dissected out, doubly  clipped, divided. The gallbladder is excised from the gallbladder bed using  the spatula electrocautery for hemostasis. The gallbladder is completely  excised and placed into an EndoCatch bag. It is extracted through the  umbilical port. On palpation, it contains at least one large gallstone. The  right upper quadrant is irrigated with warm saline which is evacuated. Good  hemostasis is noted. Ports are removed under direct vision and good  hemostasis is noted at all port sites. Pneumoperitoneum is released. The #0  Vicryl pursestring suture is tied securely at the umbilicus.  Port sites are  anesthetized with local anesthetic. All wounds are closed with interrupted 4-  0 Vicryl subcuticular sutures. The wounds are washed and dried and Benzoin  and Steri-Strips are applied. Sterile gauze dressings are applied. The  patient is awakened from anesthesia and brought to the recovery room in  stable condition. The patient tolerated the procedure well.                                               Velora Heckler, M.D.    TMG/MEDQ  D:  04/24/2004  T:  04/24/2004  Job:  [id number]   cc:   [name], M.D.  [address]  [place]  [place] [address]  Fax: [phone]

## 2011-04-19 NOTE — Op Note (Signed)
NAMELYNELL, Brittney [name]                            ACCOUNT NO.:  [id number]   MEDICAL RECORD NO.:  [id number]                   PATIENT TYPE:  AMB   LOCATION:  XRAY                                 FACILITY:  [facility]   PHYSICIAN:  John C. Madilyn Fireman, M.D.                 DATE OF BIRTH:  06/13/1961   DATE OF PROCEDURE:  12/27/2003  DATE OF DISCHARGE:                                 OPERATIVE REPORT   PROCEDURE:  Esophagogastroduodenoscopy.   INDICATION FOR PROCEDURE:  Chronic worsening severe reflux symptoms not  controlled well on proton pump inhibitor.   DESCRIPTION OF PROCEDURE:  The patient was placed in the left lateral  decubitus position and placed on the pulse monitor with continuous low-flow  oxygen delivered by nasal cannula.  She was sedated with 75 mcg IV fentanyl  and 8 mg IV Versed.  The Olympus video endoscope was advanced under direct  vision into the oropharynx and esophagus.  The esophagus was straight and of  normal caliber with the squamocolumnar line at 34 cm above a broad, 4 cm  hiatal hernia.  There was some reflux gastric contents noted during the  procedure.  There was no visible hiatal hernia, ring, or stricture.  There  did appear to be some nonspecific fibrosis near the GE junction without any  significant narrowing of the lumen.  The stomach was entered, and a small  amount of liquid secretions were suctioned from the fundus.  Retroflexed  view of the cardia confirmed the broad hiatal hernia and was otherwise  unremarkable.  The fundus, body, antrum, and pylorus all appeared normal.  The duodenum was entered, and both the bulb and second portion were well-  inspected and appeared to be within normal limits.  The scope was then  withdrawn and the patient returned to the recovery room in stable condition.  She tolerated the procedure well, and there were no immediate complications.   IMPRESSION:  A 4 cm hiatal hernia.   PLAN:  We will increase proton pump inhibitor to  b.i.d. dosing and if this  is not satisfactory, she would probably be a good candidate for antireflux  surgery.                                               John C. Madilyn Fireman, M.D.    J[facility]/MEDQ  D:  12/27/2003  T:  12/27/2003  Job:  [id number]   cc:   [name], M.D.  [address] [place]  [place] [address]  Fax: [phone]

## 2012-06-12 ENCOUNTER — Other Ambulatory Visit: Payer: Self-pay | Admitting: Family Medicine

## 2012-06-12 ENCOUNTER — Ambulatory Visit
Admission: RE | Admit: 2012-06-12 | Discharge: 2012-06-12 | Disposition: A | Discharge status: Home / Self Care | Payer: BC Managed Care – PPO | Source: Ambulatory Visit | Attending: Family Medicine | Admitting: Family Medicine

## 2012-06-12 DIAGNOSIS — R52 Pain, unspecified: Secondary | ICD-10-CM

## 2013-08-24 ENCOUNTER — Other Ambulatory Visit: Payer: Self-pay | Admitting: Gastroenterology

## 2017-05-28 ENCOUNTER — Ambulatory Visit (INDEPENDENT_AMBULATORY_CARE_PROVIDER_SITE_OTHER): Payer: BC Managed Care – PPO | Admitting: Family

## 2017-05-28 ENCOUNTER — Encounter (INDEPENDENT_AMBULATORY_CARE_PROVIDER_SITE_OTHER): Payer: Self-pay | Admitting: Family

## 2017-05-28 VITALS — BP 117/79 | HR 77 | Temp 98.0°F | Resp 14 | Ht 64.0 in | Wt 131.0 lb

## 2017-05-28 DIAGNOSIS — H9203 Otalgia, bilateral: Secondary | ICD-10-CM

## 2017-05-28 DIAGNOSIS — H1031 Unspecified acute conjunctivitis, right eye: Secondary | ICD-10-CM

## 2017-05-28 DIAGNOSIS — H1131 Conjunctival hemorrhage, right eye: Secondary | ICD-10-CM

## 2017-05-28 DIAGNOSIS — J069 Acute upper respiratory infection, unspecified: Secondary | ICD-10-CM

## 2017-05-28 DIAGNOSIS — L03213 Periorbital cellulitis: Principal | ICD-10-CM

## 2017-05-28 DIAGNOSIS — J040 Acute laryngitis: Secondary | ICD-10-CM

## 2017-05-28 MED ORDER — GUAIFENESIN-CODEINE 100-10 MG/5ML OR SOLN
5.00 mL | Freq: Four times a day (QID) | ORAL | 0 refills | Status: AC | PRN
Start: 2017-05-28 — End: ?

## 2017-05-28 MED ORDER — AMOXICILLIN-POT CLAVULANATE 875-125 MG OR TABS
1.00 | ORAL_TABLET | Freq: Two times a day (BID) | ORAL | 0 refills | Status: AC
Start: 2017-05-28 — End: 2017-06-07

## 2017-05-28 MED ORDER — PSEUDOEPHEDRINE HCL 30 MG OR TABS
60.00 mg | ORAL_TABLET | Freq: Four times a day (QID) | ORAL | 0 refills | Status: AC | PRN
Start: 2017-05-28 — End: ?

## 2017-05-28 MED ORDER — POLYMYXIN B-TRIMETHOPRIM 10000-0.1 UNIT/ML-% OP SOLN
1.00 [drp] | Freq: Four times a day (QID) | OPHTHALMIC | 0 refills | Status: AC
Start: 2017-05-28 — End: ?

## 2017-05-28 NOTE — Progress Notes (Signed)
Chief Complaint:   Chief Complaint   Patient presents with    Ear Pain    Eye Redness        HPI:  Jordan Hull is a 56 year old female  who presents with nasal congestion, runny nose, cough for more than a week now.  Patient developed right eye redness 4 days ago then yellowish discharge and redness of the upper and lower eyelid yesterday.  Patient also reports hoarse voice for a while now due to coughing.  She has been using her old medication TobraDex twice a day, Afrin, Advil, Delsym with no relief.  Denies use of contact lenses, history of trauma, photophobia, tearing.  Patient has been coughing a lot both during daytime and at night.  She denies fever, body aches, chills.  She also complains of bilateral ear pain with no decrease in hearing, ear discharge, recent swimming.  Patient states that her upper eyelid hurts when she is moving her eye around but denies pain behind her eyes.    [place].  She was advised by her boss to get her eyes checked.    History reviewed. No pertinent past medical history.  No past surgical history on file.  Social History     Social History    Marital status: Married     Spouse name: N/A    Number of children: N/A    Years of education: N/A     Occupational History    Not on file.     Social History Main Topics    Smoking status: Never Smoker    Smokeless tobacco: Never Used    Alcohol use 15.0 oz/week     3 Glasses of wine per week    Drug use: Not on file    Sexual activity: Not on file     Other Topics Concern    Not on file     Social History Narrative    No narrative on file     No family history on file.  Medication Review Moment  Cannot display prior to admission medications because the patient has not been admitted in this contact.       Review of Systems -ROS: Except as documented above, all other systems were reviewed and were negative    Physical Examination:    05/28/17  0700   BP: 117/79   Pulse: 77   Resp: 14     Temp: 98 F (36.7 C)   SpO2: 97%     Vital Signs noted from Triage Page.  Jordan Hull is a well developed well nourished female in no apparent distress. she is alert and oriented x3.   HEENT exam shows:  Ears TM clear bilaterally. + mild erythema of bilateral TMs with no bulging.  No tragus tenderness to palpation, pain when pulling pinna bilaterally.  There is no ear canal swelling, erythema, ear discharge bilaterally  Eyes PERRL EOMI.  Positive subconjunctival hemorrhage on the right eye.  There is erythema and edema on the right upper eyelid and right lower eyelids.  There is no photophobia, tearing, eye discharge at time of examination.  Mouth MMM.  There is no tonsillar swelling, exudates, sores.  Neck exam is non-tender. Supple. No lymphadenopathy.  Chest exam -No retractions. clear to auscultation bilaterally.   Cardiac exam shows regular rate and rhythm.   Extremity exam shows no cyanosis, clubbing or edema  Neuro exam with grossly nonfocal motor/sensory examination. Stable gait.  Skin exam shows no lesions or rashes.    Impression:     56 year old   female presenting with cough, nasal congestion, runny nose for more than a week now.  She developed redness on the right eye then yellowish eye discharge 3 days ago.  She developed right upper eyelid and right lower eyelid edema and erythema.  She also reports hoarse voice for a while now due to persistent coughing, bilateral ear pain.  Her symptoms were not resolved with Afrin, Advil, Delsym.  She denies fever, body aches, chills, decrease in hearing, ear discharge, history of trauma, use of contact lenses.  Vital signs are stable and patient is well appearing.    Doubt orbital abscess - no pain with movement of the eye.      ICD-10-CM ICD-9-CM    1. Periorbital cellulitis of right eye L03.213 682.0 amoxicillin-clavulanate (AUGMENTIN) 875-125 MG tablet   2. Upper respiratory tract infection, unspecified type J06.9 465.9 guaiFENesin-codeine (ROBITUSSIN AC)  100-10 MG/5ML oral solution      pseudoePHEDrine (SUDAFED) 30 MG tablet   3. Otalgia of both ears H92.03 388.70    4. Laryngitis J04.0 464.00    5. Acute bacterial conjunctivitis of right eye H10.31 372.03 polymyxin b-trimethoprim (POLYTRIM) 10000-0.1 UNIT/ML-% ophthalmic solution   6. Subconjunctival hemorrhage of right eye H11.31 372.72      Plan:    Prescribed Augmentin b.i.d. for 10 days for periorbital cellulitis.  Prescribed Polytrim eyedrops every 6 hours for bacterial conjunctivitis.  Patient was advised a subconjunctival hemorrhage may be due to excessive coughing and will take a while to resolve.  She was prescribed guaifenesin with codeine which she had taken in the past and was tolerated well.  She was advised to continue nasal decongestants, ibuprofen for pain, saline nasal rinse, increase oral fluid intake, you mid fire for her symptoms.  Return precautions given  Patient to arrange follow up with her primary care provider.  Patient comfortable with plan.     RX sent via e-prescription

## 2017-05-28 NOTE — Patient Instructions (Addendum)
Periorbital Cellulitis    You have been diagnosed with a periorbital cellulitis.    Periorbital cellulitis is a bacterial infection. It happens around the skin of the eye. It is often caused by skin trauma (injury) or a sinus infection that spreads to the skin around the eye.    Some symptoms of periorbital cellulitis are: Fever (temperature higher than 100.1F / 38C) and redness and swelling around the eye. Surrounding skin may be warm. You should not have vision problems or pain when moving the eyes.    Periorbital cellulitis is treated with antibiotics. It is also treated with warm compresses (like a warm wet washcloth). Get checked daily by a doctor until symptoms get better.    Your doctor will want to check your response to antibiotics. Check in with your doctor daily until symptoms clearly get better.    YOU SHOULD SEEK MEDICAL ATTENTION IMMEDIATELY, EITHER HERE OR AT THE NEAREST EMERGENCY DEPARTMENT, IF ANY OF THE FOLLOWING OCCURS:   Eye pain, especially pain when moving the eye in any direction.   Swelling of the eye (not just the skin around it). Others may notice that one eye seems to "stick out" more than the other.   Headache or stiff neck. Increasing fever (temperature higher than 100.1F / 38C), lethargy (low energy), confusion or looking sick.   Symptoms get worse or failure to get better every day.     Conjunctivitis    You were diagnosed with conjunctivitis. This is also called "pink eye".    Conjunctivitis is an inflammation of the conjunctiva. These are the thin coverings of the white part of the eye and insides of the eyelids. It is caused by many different things. This includes viruses and bacteria. It even includes chemicals. Particles of junk that irritate the eye can also be a cause. Viruses are the most common cause.    Symptoms of conjunctivitis include pink eye and redness and drainage. It may feel like there is something in your eye (foreign body sensation). Your lid may  get swollen. Your eyelids may also mat or get stuck in the morning.    Conjunctivitis can be very contagious. This means it can easily spread to others. You SHOULD NOT share hygienic items. This includes towels, make-up and tissues. You SHOULD NOT share clothing items. Wash your hands several times a day. Avoid touching your eyes.    Bacterial conjunctivitis is treated with warm compresses. It is also treated with ophthalmic (eye) antibiotics. These antibiotics are topical (not swallowed). Medicine is generally used for 5-7 days.    You SHOULD NOT wear contact lenses while you have conjunctivitis. Wait 48 hours after the infection completely clears up before using them again.  Discontinue tobradex for now and use prescribed Polytrim.    It is always a good idea to get rechecked by your eye doctor if possible.    YOU SHOULD SEEK MEDICAL ATTENTION IMMEDIATELY, EITHER HERE OR AT THE NEAREST EMERGENCY DEPARTMENT, IF ANY OF THE FOLLOWING OCCURS:   Increasing eye pain.   Vision problems (problems seeing).   Photophobia (light bothering your eyes).   You don't get better after a few days or symptoms get worse at any time.    If you can't follow up with your doctor, or if at any time you feel you need to be rechecked or seen again, come back here or go to the nearest emergency department.  Upper Respiratory Infection    You have been diagnosed with a viral  upper respiratory infection, often called a "URI."    The symptoms of a viral upper respiratory infection may include fever (temperature higher than 100.256F / 38C), runny nose, congestion and sinus fullness, facial pain, earache, sore throat, cough, and occasionally wheezing. The symptoms usually improve within 3 to 4 days. It might take up to 10 days before you feel completely better.    URIs are treated with fluids, rest, and medication for fever and pain. Sometimes decongestants, cough medications or-medications for wheezing can also  help.    Antibiotics have NO effect whatsoever on viruses and ARE NOT needed for a URI. Taking antibiotics when they are not necessary can cause side effects (like diarrhea or allergic reactions). It can also cause resistance, meaning antibiotics wont work when you need them in the future.    YOU SHOULD SEEK MEDICAL ATTENTION IMMEDIATELY, EITHER HERE OR AT THE NEAREST EMERGENCY DEPARTMENT, IF ANY OF THE FOLLOWING OCCURS:   You have new or worse symptoms or concerns.   You are short of breath.   You have a severe headache, stiff neck, confusion, or problems thinking.   You have nasal discharge, fever (temperature higher than 100.256F / 38C), or productive cough (one that brings up mucous from the lungs) lasting more that 10 days. Occasionally a viral cold may lead into a bacterial infection, such as a sinus infection, ear infection or pneumonia. Taking antibiotics during the cold will NOT prevent these infections, but if a secondary infection occurs, you might require additional treatment.    If you can't follow up with your doctor, or if at any time you feel you need to be rechecked or seen again, come back here or go to the nearest emergency department.

## 2018-03-10 ENCOUNTER — Ambulatory Visit (INDEPENDENT_AMBULATORY_CARE_PROVIDER_SITE_OTHER): Payer: BC Managed Care – PPO

## 2018-03-10 ENCOUNTER — Encounter (HOSPITAL_COMMUNITY): Payer: Self-pay | Admitting: Emergency Medicine

## 2018-03-10 ENCOUNTER — Other Ambulatory Visit: Payer: Self-pay

## 2018-03-10 ENCOUNTER — Ambulatory Visit (HOSPITAL_COMMUNITY)
Admission: EM | Admit: 2018-03-10 | Discharge: 2018-03-10 | Disposition: A | Discharge status: Home / Self Care | Payer: BC Managed Care – PPO | Attending: Family Medicine | Admitting: Family Medicine

## 2018-03-10 DIAGNOSIS — M25531 Pain in right wrist: Secondary | ICD-10-CM

## 2018-03-10 MED ORDER — MELOXICAM 7.5 MG PO TABS: 7.5000 mg | ORAL_TABLET | Freq: Every day | ORAL | 0 refills | Status: DC

## 2018-03-10 NOTE — Discharge Instructions (Signed)
X-ray negative for fracture or dislocation.  Start Mobic, ice compress, elevation.  Wrist splint during activity.  This may take a few weeks to completely resolve, but should be feeling better each week.  Follow-up here or with PCP for further evaluation if symptoms not improving.

## 2018-03-10 NOTE — ED Provider Notes (Signed)
[facility]    CSN: [id number] Arrival date & time: 03/10/18  2000     History   Chief Complaint Chief Complaint  Patient presents with  . Wrist Pain    HPI Leo GrosserRonda J Grivas is a 57 y.o. female.   57 year old female comes in for  right wrist pain after fall.  States was playing with dog, slipped and fell backwards with arm outstretched.  Denies head injury, loss of consciousness.  Denies back pain, saddle anesthesia, loss of bladder or bowel control.  Pain in the right wrist that radiates up the arm.  Unable to move wrist.  Has not taken anything for the symptoms.     History reviewed. No pertinent past medical history.  There are no active problems to display for this patient.   Past Surgical History:  Procedure Laterality Date  . ABDOMINAL HYSTERECTOMY    . CHOLECYSTECTOMY    . GASTRECTOMY      OB History   None      Home Medications    Prior to Admission medications   Medication Sig Start Date End Date Taking? Authorizing Provider  meloxicam (MOBIC) 7.5 MG tablet Take 1 tablet (7.5 mg total) by mouth daily. 03/10/18   Belinda FisherYu, Tenicia Gural V, PA-C    Family History No family history on file.  Social History Social History   Tobacco Use  . Smoking status: Never Smoker  . Smokeless tobacco: Never Used  Substance Use Topics  . Alcohol use: Yes  . Drug use: Never     Allergies   Patient has no known allergies.   Review of Systems Review of Systems  Reason unable to perform ROS: See HPI as above.     Physical Exam Triage Vital Signs ED Triage Vitals  Enc Vitals Group     BP 03/10/18 2029 104/66     Pulse Rate 03/10/18 2029 81     Resp --      Temp 03/10/18 2029 98.1 F (36.7 C)     Temp Source 03/10/18 2029 Oral     SpO2 03/10/18 2029 99 %     Weight --      Height --      Head Circumference --      Peak Flow --      Pain Score 03/10/18 2026 7     Pain Loc --      Pain Edu? --      Excl. in GC? --    No data found.  Updated Vital  Signs BP 104/66 (BP Location: Left Arm)   Pulse 81   Temp 98.1 F (36.7 C) (Oral)   SpO2 99%    Physical Exam  Constitutional: She is oriented to person, place, and time. She appears well-developed and well-nourished. No distress.  HENT:  Head: Normocephalic and atraumatic.  Eyes: Pupils are equal, round, and reactive to light. Conjunctivae are normal.  Musculoskeletal:  No obvious swelling, erythema, increased warmth, contusion.  Tenderness to palpation of dorsal wrist, distal radius and ulna.  Decreased range of motion of the wrist.  Able to make fist, though triggers wrist pain.  Strength normal and equal bilaterally for elbows.  Strength of wrist deferred.  Sensation intact and equal bilaterally.  Radial pulse 2+ and equal bilaterally.  Cap refill less than 2 seconds.  Neurological: She is alert and oriented to person, place, and time.     UC Treatments / Results  Labs (all labs ordered are listed, but only abnormal  results are displayed) Labs Reviewed - No data to display  EKG None Radiology Dg Wrist Complete Right  Result Date: 03/10/2018 CLINICAL DATA:  Larey Seat while walking dog today.  RIGHT wrist pain. EXAM: RIGHT WRIST - COMPLETE 3+ VIEW COMPARISON:  None. FINDINGS: There is no evidence of fracture or dislocation. There is no evidence of arthropathy or other focal bone abnormality. Soft tissues are unremarkable. IMPRESSION: Negative. Electronically Signed   By: Awilda Metro M.D.   On: 03/10/2018 20:26    Procedures Procedures (including critical care time)  Medications Ordered in UC Medications - No data to display   Initial Impression / Assessment and Plan / UC Course  I have reviewed the triage vital signs and the nursing notes.  Pertinent labs & imaging results that were available during my care of the patient were reviewed by me and considered in my medical decision making (see chart for details).    X-ray negative for fracture or dislocation.  Mobic, ice  compress, elevation, wrist splint during activity.  Discussed with patient symptoms may take a few weeks to completely resolve, but should be feeling better each week.  Return precautions given.  Patient expresses understanding and agrees to plan.  Final Clinical Impressions(s) / UC Diagnoses   Final diagnoses:  Right wrist pain    ED Discharge Orders        Ordered    meloxicam (MOBIC) 7.5 MG tablet  Daily     03/10/18 2053       Belinda Fisher, PA-C 03/10/18 2057

## 2018-03-10 NOTE — ED Triage Notes (Signed)
Dog tripped pt today and she fell on right wrist

## 2019-12-17 ENCOUNTER — Encounter: Payer: Self-pay | Admitting: Hospital

## 2020-03-02 ENCOUNTER — Encounter (INDEPENDENT_AMBULATORY_CARE_PROVIDER_SITE_OTHER): Payer: Self-pay

## 2024-09-22 ENCOUNTER — Telehealth: Payer: Self-pay | Admitting: Gastroenterology

## 2024-09-22 NOTE — Telephone Encounter (Signed)
 Inbound call from patient requesting to schedule for a Colonoscopy. She is current patient of [facility]. Patient no longer wants to continue her care with [facility]. Patient has not like how her care has been handle over at there office. Patient is going to be faxing over her colon report and pathology. Please advise.

## 2024-09-24 ENCOUNTER — Other Ambulatory Visit: Payer: Self-pay

## 2024-09-24 ENCOUNTER — Encounter (HOSPITAL_COMMUNITY): Payer: Self-pay | Admitting: Emergency Medicine

## 2024-09-24 ENCOUNTER — Emergency Department (HOSPITAL_COMMUNITY)
Admission: EM | Admit: 2024-09-24 | Discharge: 2024-09-24 | Disposition: A | Discharge status: Home / Self Care | Attending: Emergency Medicine | Admitting: Emergency Medicine

## 2024-09-24 DIAGNOSIS — R195 Other fecal abnormalities: Secondary | ICD-10-CM

## 2024-09-24 DIAGNOSIS — K2901 Acute gastritis with bleeding: Secondary | ICD-10-CM | POA: Diagnosis not present

## 2024-09-24 LAB — URINALYSIS, ROUTINE W REFLEX MICROSCOPIC
Bacteria, UA: NONE SEEN
Bilirubin Urine: NEGATIVE
Glucose, UA: NEGATIVE mg/dL
Ketones, ur: NEGATIVE mg/dL
Nitrite: NEGATIVE
Protein, ur: NEGATIVE mg/dL
Specific Gravity, Urine: 1.023 (ref 1.005–1.030)
pH: 5 (ref 5.0–8.0)

## 2024-09-24 LAB — COMPREHENSIVE METABOLIC PANEL WITH GFR
ALT: 15 U/L (ref 0–44)
AST: 19 U/L (ref 15–41)
Albumin: 4.3 g/dL (ref 3.5–5.0)
Alkaline Phosphatase: 89 U/L (ref 38–126)
Anion gap: 11 (ref 5–15)
BUN: 19 mg/dL (ref 8–23)
CO2: 22 mmol/L (ref 22–32)
Calcium: 9.2 mg/dL (ref 8.9–10.3)
Chloride: 106 mmol/L (ref 98–111)
Creatinine, Ser: 0.62 mg/dL (ref 0.44–1.00)
GFR, Estimated: >60 mL/min (ref >60)
Glucose, Bld: 102 mg/dL — ABNORMAL HIGH (ref 70–99)
Potassium: 4.2 mmol/L (ref 3.5–5.1)
Sodium: 139 mmol/L (ref 135–145)
Total Bilirubin: 0.4 mg/dL (ref 0.0–1.2)
Total Protein: 6.8 g/dL (ref 6.5–8.1)

## 2024-09-24 LAB — CBC
HCT: 38.2 % (ref 36.0–46.0)
HCT: 36.3 % (ref 36.0–46.0)
Hemoglobin: 12.4 g/dL (ref 12.0–15.0)
Hemoglobin: 11.9 g/dL — ABNORMAL LOW (ref 12.0–15.0)
MCH: 30.6 pg (ref 26.0–34.0)
MCH: 31.4 pg (ref 26.0–34.0)
MCHC: 32.5 g/dL (ref 30.0–36.0)
MCHC: 32.8 g/dL (ref 30.0–36.0)
MCV: 94.3 fL (ref 80.0–100.0)
MCV: 95.8 fL (ref 80.0–100.0)
Platelets: 292 K/uL (ref 150–400)
Platelets: 259 K/uL (ref 150–400)
RBC: 4.05 MIL/uL (ref 3.87–5.11)
RBC: 3.79 MIL/uL — ABNORMAL LOW (ref 3.87–5.11)
RDW: 12.8 % (ref 11.5–15.5)
RDW: 12.7 % (ref 11.5–15.5)
WBC: 5.1 K/uL (ref 4.0–10.5)
WBC: 4.6 K/uL (ref 4.0–10.5)
nRBC: 0 % (ref 0.0–0.2)
nRBC: 0 % (ref 0.0–0.2)

## 2024-09-24 LAB — LIPASE, BLOOD: Lipase: 27 U/L (ref 11–51)

## 2024-09-24 MED ORDER — ALUM & MAG HYDROXIDE-SIMETH 200-200-20 MG/5ML PO SUSP
30.0000 mL | Freq: Once | ORAL | Status: AC
  Administered 2024-09-24: 30 mL via ORAL
  Filled 2024-09-24: qty 30

## 2024-09-24 MED ORDER — PANTOPRAZOLE SODIUM 40 MG IV SOLR
40.0000 mg | Freq: Once | INTRAVENOUS | Status: AC
  Administered 2024-09-24: 40 mg via INTRAVENOUS
  Filled 2024-09-24: qty 10

## 2024-09-24 MED ORDER — LIDOCAINE VISCOUS HCL 2 % MT SOLN
15.0000 mL | Freq: Once | OROMUCOSAL | Status: AC
  Administered 2024-09-24: 15 mL via ORAL
  Filled 2024-09-24: qty 15

## 2024-09-24 NOTE — Discharge Instructions (Addendum)
 Brittney [name]  Thank you for allowing us  to take care of you today.  You came to the Emergency Department today because of had abdominal pain intermittently of the past couple days after recently taking Advil for approximately a week.  Here in the emergency department you had dark stool on exam, however your blood counts were normal and remained normal on recheck.  Given your belly is soft, and you are not having any other symptoms such as chest pain, shortness of breath, lightheadedness, dizziness, etc., you are safe to follow-up this outpatient.  It is possible that you had some irritation of your stomach from the Advil, causing small amount of bleeding that you are now pooping out and it is black because it is digested blood.  You are feeling better after getting some antiacid medications  You should take antiacid medicine every day for the next 2 weeks, you can take a proton pump inhibitor (Prilosec/omeprazole), as well as a H2 blocker (Pepcid) both of these medications are available over-the-counter.  You also need to follow-up with both your primary care doctor as well as your gastroenterologist.  You should follow-up with both of them as soon as possible, and when you follow-up with your PCP you should have a repeat blood count drawn to make sure that your blood count is remaining stable, if it was dropping that would mean that you would have worsening blood loss.  To-Do: 1. Please follow-up with your primary doctor within 1 - 2 weeks / as soon as possible.   Please return to the Emergency Department or call 911 if you experience have worsening of your symptoms, or do not get better, worsening bleeding, red blood per rectum, abdominal pain, fevers, chills, chest pain, shortness of breath, severe or significantly worsening pain, high fever, severe confusion, pass out or have any reason to think that you need emergency medical care.   We hope you feel better soon.   Mitzie Later, MD Department  of Emergency Medicine [facility] [place]

## 2024-09-24 NOTE — ED Provider Notes (Signed)
 [place] EMERGENCY DEPARTMENT AT [facility] Provider Note   CSN: [id number] Arrival date & time: 09/24/24  0730     History Chief Complaint  Patient presents with   Abdominal Pain    HPI: Brittney [name] is a 63 y.o. female with history pertinent GERD with prior Nissen fundoplication who presents complaining of dark stool. Patient arrived via POV.  History provided by patient.  No interpreter required during this encounter.  Patient reports that she was in her normal state of health up until 2 days ago when she developed some epigastric discomfort.  This resolved spontaneously, however recurred yesterday after she ate a heavy lunch.  Reports that the abdominal pain continued throughout the day, though notes that she did not have any nausea or vomiting.  Throughout the night she did have several episodes of dark stool, she has not had any fever, chills, chest pain, shortness of breath, lightheadedness, she does not have any rectal pain.  Denies prior history of rectal bleeding.  Denies use of iron supplements, multivitamins, Pepto-Bismol, does note that she has been taking 2-4 Advil per day over the past week due to arthritis pain from picking up her grandchildren.  Reports that her most recent prior colonoscopy was approximately 10 years ago, and that she is due for colonoscopy currently, reports that she had a low risk polyp on her prior colonoscopy.  Patient's recorded medical, surgical, social, medication list and allergies were reviewed in the Snapshot window as part of the initial history.   Prior to Admission medications   Medication Sig Start Date End Date Taking? Authorizing Provider  meloxicam  (MOBIC ) 7.5 MG tablet Take 1 tablet (7.5 mg total) by mouth daily. 03/10/18   Babara Greig GAILS, PA-C     Allergies: Patient has no known allergies.   Review of Systems   ROS as per HPI  Physical Exam Updated Vital Signs BP 127/85   Pulse 85   Temp 98.4 F (36.9 C) (Oral)    Resp 17   SpO2 97%  Physical Exam Vitals and nursing note reviewed.  Constitutional:      General: She is not in acute distress.    Appearance: She is well-developed.  HENT:     Head: Normocephalic and atraumatic.  Eyes:     Conjunctiva/sclera: Conjunctivae normal.  Cardiovascular:     Rate and Rhythm: Normal rate and regular rhythm.     Heart sounds: No murmur heard. Pulmonary:     Effort: Pulmonary effort is normal. No respiratory distress.     Breath sounds: Normal breath sounds.  Abdominal:     Palpations: Abdomen is soft.     Tenderness: There is no abdominal tenderness.  Genitourinary:    Rectum: Guaiac result positive. No tenderness, anal fissure, external hemorrhoid or internal hemorrhoid. Normal anal tone.  Musculoskeletal:        General: No swelling.     Cervical back: Neck supple.  Skin:    General: Skin is warm and dry.     Capillary Refill: Capillary refill takes less than 2 seconds.  Neurological:     Mental Status: She is alert.  Psychiatric:        Mood and Affect: Mood normal.     ED Course/ Medical Decision Making/ A&P    Procedures Procedures   Medications Ordered in ED Medications  alum & mag hydroxide-simeth (MAALOX/MYLANTA) 200-200-20 MG/5ML suspension 30 mL (30 mLs Oral Given 09/24/24 0914)    And  lidocaine (XYLOCAINE) 2 %  viscous mouth solution 15 mL (15 mLs Oral Given 09/24/24 0914)  pantoprazole (PROTONIX) injection 40 mg (40 mg Intravenous Given 09/24/24 0914)    Medical Decision Making:   [name] is a 63 y.o. female who presents for dark stools as per above.  Physical exam is pertinent for melena.  The differential includes but is not limited to diverticular hemorrhage, gastritis, hemorrhoids, blood loss anemia, malignancy.  Independent historian: None  External data reviewed: No pertinent external data  Labs: Ordered, Independent interpretation, and Details: UA without UTI.  CBC without leukocytosis, anemia,  thrombocytopenia, initial hemoglobin 12.4.  Stable at 11.9 on recheck. CMP without AKI, emergent lactic derangement, emergent LFT abnormality.  Lipase WNL, fecal occult positive.  Radiology: Not indicated No results found.  EKG/Medicine tests: Not indicated EKG Interpretation:    Interventions: Protonix, lidocaine/Maalox  See the EMR for full details regarding lab and imaging results.  Patient presents to emergency department for 1 day of dark stools in the setting of recent discomfort after eating and increased NSAID use for musculoskeletal pain.  Patient is well-appearing on exam, hemodynamically stable.  Patient is not anticoagulated, has soft, nontender abdomen.  Doubt brisk bleed given patient without red or maroon blood per rectum, no hemodynamic instability.  Additionally patient is without symptomatic anemia.  Patient does have melena.  Patient with reassuring hemoglobin, does not have drop in hemoglobin while in the ED.  Given patient reports pain after eating and recent NSAID use, trialed Protonix as well as lidocaine/Maalox and patient reports that she did have improvement in upper abdominal discomfort.  Given exam is reassuring, patient is hemodynamically stable, blood counts are reassuring, feel that patient's melena is most likely related to bleeding gastritis, patient has established care with gastroenterology as well as a PCP, and would prefer to pursue outpatient management, which I believe is very reasonable.  Discussed a 2-week course of dual therapy Pepcid and omeprazole, discontinuation of NSAIDs, close follow-up with her PCP as well as gastroenterologist, as well as strict return precautions for any symptomatic anemia, worsening bleeding, abdominal pain, new different symptoms, patient expressed understanding, comfortable for plan with discharge and outpatient follow-up, patient discharged.  Presentation is most consistent with acute complicated illness and I did consider and rule  out acute life/limb-threatening illness  Discussion of management or test interpretations with external provider(s): Not indicated  Risk Drugs:OTC drugs  Disposition: DISCHARGE: I believe that the patient is safe for discharge home with outpatient follow-up. Patient was informed of all pertinent physical exam, laboratory, and imaging findings. Patient's suspected etiology of their symptom presentation was discussed with the patient and all questions were answered. We discussed following up with PCP, gastroenterology. I provided thorough ED return precautions. The patient feels safe and comfortable with this plan.  MDM generated using voice dictation software and may contain dictation errors.  Please contact me for any clarification or with any questions.  Clinical Impression:  1. Dark stools   2. Acute gastritis with hemorrhage, unspecified gastritis type      Discharge   Final Clinical Impression(s) / ED Diagnoses Final diagnoses:  Dark stools  Acute gastritis with hemorrhage, unspecified gastritis type    Rx / DC Orders ED Discharge Orders     None        Rogelia Jerilynn RAMAN, MD 09/26/24 1239

## 2024-09-24 NOTE — ED Triage Notes (Signed)
 Pt reports abdominal pain and black tarry stool that started last night. Denies taking any medications.

## 2024-11-09 ENCOUNTER — Encounter: Payer: Self-pay | Admitting: Family Medicine

## 2024-11-09 ENCOUNTER — Ambulatory Visit: Admitting: Family Medicine

## 2024-11-09 VITALS — BP 140/80 | HR 79 | Temp 97.8°F | Ht 63.5 in | Wt 150.4 lb

## 2024-11-09 DIAGNOSIS — E7800 Pure hypercholesterolemia, unspecified: Secondary | ICD-10-CM | POA: Insufficient documentation

## 2024-11-09 DIAGNOSIS — Z9889 Other specified postprocedural states: Secondary | ICD-10-CM | POA: Insufficient documentation

## 2024-11-09 DIAGNOSIS — Z8249 Family history of ischemic heart disease and other diseases of the circulatory system: Secondary | ICD-10-CM

## 2024-11-09 DIAGNOSIS — Z1211 Encounter for screening for malignant neoplasm of colon: Secondary | ICD-10-CM

## 2024-11-09 DIAGNOSIS — K219 Gastro-esophageal reflux disease without esophagitis: Secondary | ICD-10-CM | POA: Insufficient documentation

## 2024-11-09 DIAGNOSIS — I10 Essential (primary) hypertension: Secondary | ICD-10-CM | POA: Insufficient documentation

## 2024-11-09 DIAGNOSIS — Z820 Family history of epilepsy and other diseases of the nervous system: Secondary | ICD-10-CM

## 2024-11-09 DIAGNOSIS — Z8669 Personal history of other diseases of the nervous system and sense organs: Secondary | ICD-10-CM | POA: Insufficient documentation

## 2024-11-09 NOTE — Assessment & Plan Note (Signed)
 Chronic, previoiusly well controlled with lifestyle.. now with weight  may be returning.  Follow BP at home and call if > 140/90 consistently.  Encouraged exercise, weight loss, healthy eating habits.

## 2024-11-09 NOTE — Assessment & Plan Note (Signed)
 Due for re-eval.

## 2024-11-09 NOTE — Progress Notes (Signed)
 Patient ID: Brittney [name], female    DOB: 12/18/1960, 63 y.o.   MRN: [id number]  This visit was conducted in person.  BP (!) 140/80   Pulse 79   Temp 97.8 F (36.6 C) (Temporal)   Ht 5' 3.5 (1.613 m)   Wt 150 lb 6 oz (68.2 kg)   SpO2 98%   BMI 26.22 kg/m    CC:  Chief Complaint  Patient presents with   Establish Care    Subjective:   HPI: Brittney [name] is a 63 y.o. female presenting on 11/09/2024 for Establish Care   Last PCP: Dr. Bonnye.. last visit 2019  GYN:  none recent.. S/P partial hysterectomy.  Last CPX: 2019  ER visit 09/24/2024 for dark stools.. felt secondary to acute gastritis.. placed on  PPI and famotidine. nml CBC, CMET and lipase.  Hx of Nissen fundoplication.  No further blood in stool.. she has stopped Advil since.  HTN..  in 30-40.SABRA lost weight and resolved... now has gained some weight back. BP Readings from Last 3 Encounters:  11/09/24 (!) 140/80  09/24/24 127/85  03/10/18 104/66   Walking 4-5 miles daily.  Diet: good control, no soda.  Relevant past medical, surgical, family and social history reviewed and updated as indicated. Interim medical history since our last visit reviewed. Allergies and medications reviewed and updated. Outpatient Medications Prior to Visit  Medication Sig Dispense Refill   famotidine (PEPCID) 10 MG tablet Take 10 mg by mouth daily.     meloxicam  (MOBIC ) 7.5 MG tablet Take 1 tablet (7.5 mg total) by mouth daily. 15 tablet 0   No facility-administered medications prior to visit.     Per HPI unless specifically indicated in ROS section below Review of Systems  Constitutional:  Negative for fatigue and fever.  HENT:  Negative for congestion.   Eyes:  Negative for pain.  Respiratory:  Negative for cough and shortness of breath.   Cardiovascular:  Negative for chest pain, palpitations and leg swelling.  Gastrointestinal:  Negative for abdominal pain.  Genitourinary:  Negative for dysuria and vaginal bleeding.   Musculoskeletal:  Negative for back pain.  Neurological:  Negative for syncope, light-headedness and headaches.  Psychiatric/Behavioral:  Negative for dysphoric mood.    Objective:  BP (!) 140/80   Pulse 79   Temp 97.8 F (36.6 C) (Temporal)   Ht 5' 3.5 (1.613 m)   Wt 150 lb 6 oz (68.2 kg)   SpO2 98%   BMI 26.22 kg/m   Wt Readings from Last 3 Encounters:  11/09/24 150 lb 6 oz (68.2 kg)      Physical Exam Constitutional:      General: She is not in acute distress.    Appearance: Normal appearance. She is well-developed. She is not ill-appearing or toxic-appearing.  HENT:     Head: Normocephalic.     Right Ear: Hearing, tympanic membrane, ear canal and external ear normal. Tympanic membrane is not erythematous, retracted or bulging.     Left Ear: Hearing, tympanic membrane, ear canal and external ear normal. Tympanic membrane is not erythematous, retracted or bulging.     Nose: No mucosal edema or rhinorrhea.     Right Sinus: No maxillary sinus tenderness or frontal sinus tenderness.     Left Sinus: No maxillary sinus tenderness or frontal sinus tenderness.     Mouth/Throat:     Pharynx: Uvula midline.  Eyes:     General: Lids are normal. Lids are everted, no foreign  bodies appreciated.     Conjunctiva/sclera: Conjunctivae normal.     Pupils: Pupils are equal, round, and reactive to light.  Neck:     Thyroid: No thyroid mass or thyromegaly.     Vascular: No carotid bruit.     Trachea: Trachea normal.  Cardiovascular:     Rate and Rhythm: Normal rate and regular rhythm.     Pulses: Normal pulses.     Heart sounds: Normal heart sounds, S1 normal and S2 normal. No murmur heard.    No friction rub. No gallop.  Pulmonary:     Effort: Pulmonary effort is normal. No tachypnea or respiratory distress.     Breath sounds: Normal breath sounds. No decreased breath sounds, wheezing, rhonchi or rales.  Abdominal:     General: Bowel sounds are normal.     Palpations: Abdomen is  soft.     Tenderness: There is no abdominal tenderness.  Musculoskeletal:     Cervical back: Normal range of motion and neck supple.  Skin:    General: Skin is warm and dry.     Findings: No rash.  Neurological:     Mental Status: She is alert.  Psychiatric:        Mood and Affect: Mood is not anxious or depressed.        Speech: Speech normal.        Behavior: Behavior normal. Behavior is cooperative.        Thought Content: Thought content normal.        Judgment: Judgment normal.       Results for orders placed or performed during the hospital encounter of 09/24/24  Lipase, blood   Collection Time: 09/24/24  7:51 AM  Result Value Ref Range   Lipase 27 11 - 51 U/L  Comprehensive metabolic panel   Collection Time: 09/24/24  7:51 AM  Result Value Ref Range   Sodium 139 135 - 145 mmol/L   Potassium 4.2 3.5 - 5.1 mmol/L   Chloride 106 98 - 111 mmol/L   CO2 22 22 - 32 mmol/L   Glucose, Bld 102 (H) 70 - 99 mg/dL   BUN 19 8 - 23 mg/dL   Creatinine, Ser 9.37 0.44 - 1.00 mg/dL   Calcium 9.2 8.9 - 89.6 mg/dL   Total Protein 6.8 6.5 - 8.1 g/dL   Albumin 4.3 3.5 - 5.0 g/dL   AST 19 15 - 41 U/L   ALT 15 0 - 44 U/L   Alkaline Phosphatase 89 38 - 126 U/L   Total Bilirubin 0.4 0.0 - 1.2 mg/dL   GFR, Estimated >39 >39 mL/min   Anion gap 11 5 - 15  CBC   Collection Time: 09/24/24  7:51 AM  Result Value Ref Range   WBC 5.1 4.0 - 10.5 K/uL   RBC 4.05 3.87 - 5.11 MIL/uL   Hemoglobin 12.4 12.0 - 15.0 g/dL   HCT 61.7 63.9 - 53.9 %   MCV 94.3 80.0 - 100.0 fL   MCH 30.6 26.0 - 34.0 pg   MCHC 32.5 30.0 - 36.0 g/dL   RDW 87.1 88.4 - 84.4 %   Platelets 292 150 - 400 K/uL   nRBC 0.0 0.0 - 0.2 %  Urinalysis, Routine w reflex microscopic -Urine, Clean Catch   Collection Time: 09/24/24  9:21 AM  Result Value Ref Range   Color, Urine YELLOW YELLOW   APPearance HAZY (A) CLEAR   Specific Gravity, Urine 1.023 1.005 - 1.030   pH 5.0 5.0 -  8.0   Glucose, UA NEGATIVE NEGATIVE mg/dL   Hgb  urine dipstick SMALL (A) NEGATIVE   Bilirubin Urine NEGATIVE NEGATIVE   Ketones, ur NEGATIVE NEGATIVE mg/dL   Protein, ur NEGATIVE NEGATIVE mg/dL   Nitrite NEGATIVE NEGATIVE   Leukocytes,Ua MODERATE (A) NEGATIVE   RBC / HPF 0-5 0 - 5 RBC/hpf   WBC, UA 0-5 0 - 5 WBC/hpf   Bacteria, UA NONE SEEN NONE SEEN   Squamous Epithelial / HPF 0-5 0 - 5 /HPF   Mucus PRESENT   CBC   Collection Time: 09/24/24  9:58 AM  Result Value Ref Range   WBC 4.6 4.0 - 10.5 K/uL   RBC 3.79 (L) 3.87 - 5.11 MIL/uL   Hemoglobin 11.9 (L) 12.0 - 15.0 g/dL   HCT 63.6 63.9 - 53.9 %   MCV 95.8 80.0 - 100.0 fL   MCH 31.4 26.0 - 34.0 pg   MCHC 32.8 30.0 - 36.0 g/dL   RDW 87.2 88.4 - 84.4 %   Platelets 259 150 - 400 K/uL   nRBC 0.0 0.0 - 0.2 %    Assessment and Plan  Gastroesophageal reflux disease without esophagitis Assessment & Plan:  Chronic, improved control on famotidine and no longer taking Advil.   History of Nissen fundoplication  Primary hypertension Assessment & Plan:  Chronic, previoiusly well controlled with lifestyle.. now with weight  may be returning.  Follow BP at home and call if > 140/90 consistently.  Encouraged exercise, weight loss, healthy eating habits.    High cholesterol Assessment & Plan:  Due for re-eval.   History of migraine  Family history of premature CAD  Family history of Alzheimer disease  Colon cancer screening -     Ambulatory referral to Gastroenterology    Return in about 6 weeks (around 12/21/2024) for annual physical with fasting labs prior.   Greig Ring, MD

## 2024-11-09 NOTE — Assessment & Plan Note (Signed)
 Chronic, improved control on famotidine and no longer taking Advil.

## 2024-12-03 ENCOUNTER — Telehealth: Payer: Self-pay | Admitting: *Deleted

## 2024-12-03 DIAGNOSIS — Z114 Encounter for screening for human immunodeficiency virus [HIV]: Secondary | ICD-10-CM

## 2024-12-03 DIAGNOSIS — Z1159 Encounter for screening for other viral diseases: Secondary | ICD-10-CM

## 2024-12-03 DIAGNOSIS — E7800 Pure hypercholesterolemia, unspecified: Secondary | ICD-10-CM

## 2024-12-03 NOTE — Telephone Encounter (Signed)
-----   Message from Veva JINNY Ferrari sent at 12/03/2024 11:54 AM EST ----- Regarding: Lab orders for [facility], 1.15.26 Patient is scheduled for CPX labs, please order future labs, Thanks , Veva

## 2024-12-13 ENCOUNTER — Other Ambulatory Visit (INDEPENDENT_AMBULATORY_CARE_PROVIDER_SITE_OTHER)

## 2024-12-13 DIAGNOSIS — Z114 Encounter for screening for human immunodeficiency virus [HIV]: Secondary | ICD-10-CM

## 2024-12-13 DIAGNOSIS — Z1159 Encounter for screening for other viral diseases: Secondary | ICD-10-CM

## 2024-12-13 DIAGNOSIS — E7800 Pure hypercholesterolemia, unspecified: Secondary | ICD-10-CM

## 2024-12-13 LAB — COMPREHENSIVE METABOLIC PANEL WITH GFR
ALT: 12 U/L (ref 3–35)
AST: 16 U/L (ref 5–37)
Albumin: 4.2 g/dL (ref 3.5–5.2)
Alkaline Phosphatase: 87 U/L (ref 39–117)
BUN: 13 mg/dL (ref 6–23)
CO2: 27 meq/L (ref 19–32)
Calcium: 9.1 mg/dL (ref 8.4–10.5)
Chloride: 103 meq/L (ref 96–112)
Creatinine, Ser: 0.72 mg/dL (ref 0.40–1.20)
GFR: 89.72 mL/min
Glucose, Bld: 95 mg/dL (ref 70–99)
Potassium: 4.7 meq/L (ref 3.5–5.1)
Sodium: 138 meq/L (ref 135–145)
Total Bilirubin: 0.6 mg/dL (ref 0.2–1.2)
Total Protein: 6.6 g/dL (ref 6.0–8.3)

## 2024-12-13 LAB — LIPID PANEL
Cholesterol: 227 mg/dL — ABNORMAL HIGH (ref 28–200)
HDL: 73.2 mg/dL
LDL Cholesterol: 131 mg/dL — ABNORMAL HIGH (ref 10–99)
NonHDL: 153.71
Total CHOL/HDL Ratio: 3
Triglycerides: 113 mg/dL (ref 10.0–149.0)
VLDL: 22.6 mg/dL (ref 0.0–40.0)

## 2024-12-14 ENCOUNTER — Ambulatory Visit: Payer: Self-pay | Admitting: Family Medicine

## 2024-12-14 LAB — HIV ANTIBODY (ROUTINE TESTING W REFLEX)
HIV 1&2 Ab, 4th Generation: NONREACTIVE
HIV FINAL INTERPRETATION: NEGATIVE

## 2024-12-14 LAB — HEPATITIS C ANTIBODY: Hepatitis C Ab: NONREACTIVE

## 2024-12-14 NOTE — Progress Notes (Signed)
 No critical labs need to be addressed urgently. We will discuss labs in detail at upcoming office visit.

## 2024-12-16 ENCOUNTER — Other Ambulatory Visit

## 2024-12-23 ENCOUNTER — Encounter: Payer: Self-pay | Admitting: Family Medicine

## 2024-12-23 ENCOUNTER — Ambulatory Visit: Admitting: Family Medicine

## 2024-12-23 VITALS — BP 122/80 | HR 74 | Ht 63.5 in | Wt 148.8 lb

## 2024-12-23 DIAGNOSIS — Z23 Encounter for immunization: Secondary | ICD-10-CM | POA: Diagnosis not present

## 2024-12-23 DIAGNOSIS — I10 Essential (primary) hypertension: Secondary | ICD-10-CM | POA: Diagnosis not present

## 2024-12-23 DIAGNOSIS — Z0000 Encounter for general adult medical examination without abnormal findings: Secondary | ICD-10-CM

## 2024-12-23 DIAGNOSIS — E7800 Pure hypercholesterolemia, unspecified: Secondary | ICD-10-CM | POA: Diagnosis not present

## 2024-12-23 NOTE — Assessment & Plan Note (Addendum)
"   Chronic, inadequate control. Goal LDL < 100 ASCVD 10-year risk at 3.4%, but also first degree history of premature MI.  Working on lifestyle changes and low-cholesterol diet.  Will return for recheck in 3 to 6 months. "

## 2024-12-23 NOTE — Progress Notes (Signed)
 "   Patient ID: Brittney [name], female    DOB: 1961/10/18, 64 y.o.   MRN: [id number]  This visit was conducted in person.  BP 122/80 (BP Location: Right Arm, Patient Position: Sitting, Cuff Size: Normal)   Pulse 74   Ht 5' 3.5 (1.613 m)   Wt 148 lb 12.8 oz (67.5 kg)   SpO2 97%   BMI 25.95 kg/m    CC:  Chief Complaint  Patient presents with   Annual Exam    Pt is going to call and scheduled mammogram and colonscopy after appt today  Pt would like to discuss prevnar and shingrix vaccines Pt has no acute concerns today    Subjective:   HPI: Brittney [name] is a 64 y.o. female presenting on 12/23/2024 for Annual Exam (Pt is going to call and scheduled mammogram and colonscopy after appt today/ Pt would like to discuss prevnar and shingrix vaccines/Pt has no acute concerns today)   The patient presents for  complete physical and review of chronic health problems. He/She also has the following acute concerns today: None   GYN:  none recent.. S/P partial hysterectomy.  Last CPX: 2019  ER visit 09/24/2024 for dark stools.. felt secondary to acute gastritis.. placed on  PPI and famotidine. nml CBC, CMET and lipase.  Hx of Nissen fundoplication.  No further blood in stool.. she has stopped Advil since.  HTN.SABRA   No further palpitations.  No CP, no SOB. BP Readings from Last 3 Encounters:  12/23/24 122/80  11/09/24 (!) 140/80  09/24/24 127/85   Walking 4-5 miles daily.  Diet: good control, no soda.  Wt Readings from Last 3 Encounters:  12/23/24 148 lb 12.8 oz (67.5 kg)  11/09/24 150 lb 6 oz (68.2 kg)   Elevated Cholesterol: Not at goal.. starting to work on lifestyle change.4 Father with MI age 71s. Lab Results  Component Value Date   CHOL 227 (H) 12/13/2024   HDL 73.20 12/13/2024   LDLCALC 131 (H) 12/13/2024   TRIG 113.0 12/13/2024   CHOLHDL 3 12/13/2024  The 10-year ASCVD risk score (Arnett DK, et al., 2019) is: 3.4%   Values used to calculate the score:     Age: 27  years     Clinically relevant sex: Female     Is Non-Hispanic African American: No     Diabetic: No     Tobacco smoker: No     Systolic Blood Pressure: 122 mmHg     Is BP treated: No     HDL Cholesterol: 73.2 mg/dL     Total Cholesterol: 227 mg/dL   Relevant past medical, surgical, family and social history reviewed and updated as indicated. Interim medical history since our last visit reviewed. Allergies and medications reviewed and updated. Outpatient Medications Prior to Visit  Medication Sig Dispense Refill   famotidine (PEPCID) 10 MG tablet Take 10 mg by mouth daily.     No facility-administered medications prior to visit.     Per HPI unless specifically indicated in ROS section below Review of Systems  Constitutional:  Negative for fatigue and fever.  HENT:  Negative for congestion.   Eyes:  Negative for pain.  Respiratory:  Negative for cough and shortness of breath.   Cardiovascular:  Negative for chest pain, palpitations and leg swelling.  Gastrointestinal:  Negative for abdominal pain.  Genitourinary:  Negative for dysuria and vaginal bleeding.  Musculoskeletal:  Negative for back pain.  Neurological:  Negative for syncope, light-headedness and headaches.  Psychiatric/Behavioral:  Negative for dysphoric mood.    Objective:  BP 122/80 (BP Location: Right Arm, Patient Position: Sitting, Cuff Size: Normal)   Pulse 74   Ht 5' 3.5 (1.613 m)   Wt 148 lb 12.8 oz (67.5 kg)   SpO2 97%   BMI 25.95 kg/m   Wt Readings from Last 3 Encounters:  12/23/24 148 lb 12.8 oz (67.5 kg)  11/09/24 150 lb 6 oz (68.2 kg)      Physical Exam Constitutional:      General: She is not in acute distress.    Appearance: Normal appearance. She is well-developed. She is not ill-appearing or toxic-appearing.  HENT:     Head: Normocephalic.     Right Ear: Hearing, tympanic membrane, ear canal and external ear normal. Tympanic membrane is not erythematous, retracted or bulging.     Left  Ear: Hearing, tympanic membrane, ear canal and external ear normal. Tympanic membrane is not erythematous, retracted or bulging.     Nose: No mucosal edema or rhinorrhea.     Right Sinus: No maxillary sinus tenderness or frontal sinus tenderness.     Left Sinus: No maxillary sinus tenderness or frontal sinus tenderness.     Mouth/Throat:     Pharynx: Uvula midline.  Eyes:     General: Lids are normal. Lids are everted, no foreign bodies appreciated.     Conjunctiva/sclera: Conjunctivae normal.     Pupils: Pupils are equal, round, and reactive to light.  Neck:     Thyroid: No thyroid mass or thyromegaly.     Vascular: No carotid bruit.     Trachea: Trachea normal.  Cardiovascular:     Rate and Rhythm: Normal rate and regular rhythm.     Pulses: Normal pulses.     Heart sounds: Normal heart sounds, S1 normal and S2 normal. No murmur heard.    No friction rub. No gallop.  Pulmonary:     Effort: Pulmonary effort is normal. No tachypnea or respiratory distress.     Breath sounds: Normal breath sounds. No decreased breath sounds, wheezing, rhonchi or rales.  Abdominal:     General: Bowel sounds are normal.     Palpations: Abdomen is soft.     Tenderness: There is no abdominal tenderness.  Musculoskeletal:     Cervical back: Normal range of motion and neck supple.  Skin:    General: Skin is warm and dry.     Findings: No rash.  Neurological:     Mental Status: She is alert.  Psychiatric:        Mood and Affect: Mood is not anxious or depressed.        Speech: Speech normal.        Behavior: Behavior normal. Behavior is cooperative.        Thought Content: Thought content normal.        Judgment: Judgment normal.       Results for orders placed or performed in visit on 12/13/24  HIV Antibody (routine testing w rflx)   Collection Time: 12/13/24  8:35 AM  Result Value Ref Range   HIV FINAL INTERPRETATION HIV NEGATIVE    HIV 1&2 Ab, 4th Generation NON-REACTIVE NON-REACTIVE   Hepatitis C antibody   Collection Time: 12/13/24  8:35 AM  Result Value Ref Range   Hepatitis C Ab NON-REACTIVE NON-REACTIVE  Lipid panel   Collection Time: 12/13/24  8:35 AM  Result Value Ref Range   Cholesterol 227 (H) 28 - 200 mg/dL  Triglycerides 113.0 10.0 - 149.0 mg/dL   HDL 26.79 >60.99 mg/dL   VLDL 77.3 0.0 - 59.9 mg/dL   LDL Cholesterol 868 (H) 10 - 99 mg/dL   Total CHOL/HDL Ratio 3    NonHDL 153.71   Comprehensive metabolic panel with GFR   Collection Time: 12/13/24  8:35 AM  Result Value Ref Range   Sodium 138 135 - 145 mEq/L   Potassium 4.7 3.5 - 5.1 mEq/L   Chloride 103 96 - 112 mEq/L   CO2 27 19 - 32 mEq/L   Glucose, Bld 95 70 - 99 mg/dL   BUN 13 6 - 23 mg/dL   Creatinine, Ser 9.27 0.40 - 1.20 mg/dL   Total Bilirubin 0.6 0.2 - 1.2 mg/dL   Alkaline Phosphatase 87 39 - 117 U/L   AST 16 5 - 37 U/L   ALT 12 3 - 35 U/L   Total Protein 6.6 6.0 - 8.3 g/dL   Albumin 4.2 3.5 - 5.2 g/dL   GFR 10.27 >39.99 mL/min   Calcium 9.1 8.4 - 10.5 mg/dL    Assessment and Plan The patient's preventative maintenance and recommended screening tests for an annual wellness exam were reviewed in full today. Brought up to date unless services declined.  Counselled on the importance of diet, exercise, and its role in overall health and mortality. The patient's FH and SH was reviewed, including their home life, tobacco status, and drug and alcohol status.   Vaccines: Due for Prevnar 20, consider shingles,  refused flu,  consider RSV and tetanus at the pharmacy Pap/DVE: Pap not indicated, status post partial hysterectomy Mammo: Due Bone Density:  plan starting age 40, did have normal 10 years ago Colon: Due Smoking Status: Non-smoker ETOH/ drug use: Occ/none  Hep C: Done  HIV screen: Done  Routine general medical examination at a health care facility  Need for vaccination against Streptococcus pneumoniae -     Pneumococcal conjugate vaccine 20-valent  Primary  hypertension Assessment & Plan:  Chronic, well controlled with lifestyle Encouraged exercise, weight loss, healthy eating habits.    High cholesterol Assessment & Plan:  Chronic, inadequate control. Goal LDL < 100 ASCVD 10-year risk at 3.4%, but also first degree history of premature MI.  Working on lifestyle changes and low-cholesterol diet.  Will return for recheck in 3 to 6 months.  Orders: -     Lipid panel; Future    Return in about 3 months (around 03/23/2025) for lab only for recheck lipids.   Greig Ring, MD  "

## 2024-12-23 NOTE — Patient Instructions (Signed)
"   Call to set up mammogram and colonoscopy. "

## 2024-12-23 NOTE — Assessment & Plan Note (Signed)
"   Chronic, well controlled with lifestyle Encouraged exercise, weight loss, healthy eating habits.  "

## 2025-03-23 ENCOUNTER — Other Ambulatory Visit

## 2025-12-20 ENCOUNTER — Other Ambulatory Visit

## 2025-12-27 ENCOUNTER — Encounter: Admitting: Family Medicine
